# Patient Record
Sex: Female | Born: 2000 | Hispanic: Yes | Marital: Married | Smoking: Never smoker
Health system: Southern US, Community
[De-identification: ages and names within clinical notes are randomized; demographics above are authoritative.]

## PROBLEM LIST (undated history)

## (undated) DIAGNOSIS — I471 Supraventricular tachycardia: Secondary | ICD-10-CM

## (undated) DIAGNOSIS — D649 Anemia, unspecified: Secondary | ICD-10-CM

## (undated) DIAGNOSIS — R519 Headache, unspecified: Secondary | ICD-10-CM

## (undated) HISTORY — PX: TONSILLECTOMY: SUR1361

---
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
## 2020-01-14 HISTORY — PX: TONSILLECTOMY: SUR1361

## 2020-02-22 ENCOUNTER — Encounter (HOSPITAL_COMMUNITY): Payer: Self-pay | Admitting: Emergency Medicine

## 2020-02-22 ENCOUNTER — Emergency Department (HOSPITAL_COMMUNITY): Payer: PRIVATE HEALTH INSURANCE

## 2020-02-22 ENCOUNTER — Other Ambulatory Visit: Payer: Self-pay

## 2020-02-22 ENCOUNTER — Emergency Department (HOSPITAL_COMMUNITY)
Admission: EM | Admit: 2020-02-22 | Discharge: 2020-02-22 | Disposition: A | Discharge status: Home / Self Care | Payer: PRIVATE HEALTH INSURANCE | Attending: Emergency Medicine | Admitting: Emergency Medicine

## 2020-02-22 DIAGNOSIS — R0602 Shortness of breath: Secondary | ICD-10-CM | POA: Diagnosis not present

## 2020-02-22 DIAGNOSIS — R1310 Dysphagia, unspecified: Secondary | ICD-10-CM | POA: Diagnosis present

## 2020-02-22 LAB — POC URINE PREG, ED: Preg Test, Ur: NEGATIVE

## 2020-02-22 MED ORDER — DEXAMETHASONE SODIUM PHOSPHATE 10 MG/ML IJ SOLN
10.0000 mg | Freq: Once | INTRAMUSCULAR | Status: AC
  Administered 2020-02-22: 10 mg via INTRAVENOUS
  Filled 2020-02-22: qty 1

## 2020-02-22 MED ORDER — MORPHINE SULFATE (PF) 2 MG/ML IV SOLN
4.0000 mg | Freq: Once | INTRAVENOUS | Status: AC
  Administered 2020-02-22: 4 mg via INTRAVENOUS
  Filled 2020-02-22: qty 2

## 2020-02-22 MED ORDER — ONDANSETRON HCL 4 MG/2ML IJ SOLN
4.0000 mg | Freq: Once | INTRAMUSCULAR | Status: AC
  Administered 2020-02-22: 4 mg via INTRAVENOUS
  Filled 2020-02-22: qty 2

## 2020-02-22 MED ORDER — SODIUM CHLORIDE 0.9 % IV BOLUS
1000.0000 mL | Freq: Once | INTRAVENOUS | Status: AC
  Administered 2020-02-22: 1000 mL via INTRAVENOUS

## 2020-02-22 NOTE — ED Notes (Signed)
Pt requested ice pack for her neck, Ice given

## 2020-02-22 NOTE — Discharge Instructions (Addendum)
-  You were given a steroid today that should help with pain and inflammation over the next several days.  On your exam there were no obvious signs of postsurgical infection.  -Patient currently stable hydrated try to drink fluids as much as possible.  Advance your diet as tolerated.  -Follow-up with your surgeon for symptom recheck.  -Return to emergency room if any new or worsening symptoms.

## 2020-02-22 NOTE — ED Provider Notes (Signed)
[facility] EMERGENCY DEPARTMENT Provider Note   CSN: [id number] Arrival date & time: 02/22/20  1510     History Chief Complaint  Patient presents with  . Dysphagia    Cassandra Shaffer is a 20 y.o. female with no known past medical history.  HPI Patient presents to emergency room today with chief complaint of dysphagia and shortness of breath x1 day.  Patient had a tonsillectomy x 2 days ago at outside hospital.  States when she woke up this morning she felt like her throat was tight and she was having difficulty breathing. She called her surgeon who recommended she be seen in the emergency department. Patient reports she has had decreased PO intake secondary to pain. She has been unable to take pain medicine because of dysphagia. She has tried applying ice to her neck without symptom improvement. She rates pain 7/10 in severity. She denies fevers or chills at home. She was not prescribed any post op antibiotics. Also denies chest pain or hemoptysis.    History reviewed. No pertinent past medical history.  There are no problems to display for this patient.   History reviewed. No pertinent surgical history.   OB History   No obstetric history on file.     No family history on file.     Home Medications Prior to Admission medications   Not on File    Allergies    Patient has no allergy information on record.  Review of Systems   Review of Systems All other systems are reviewed and are negative for acute change except as noted in the HPI.  Physical Exam Updated Vital Signs BP 119/77   Pulse 93   Temp 98.7 F (37.1 C) (Oral)   Resp 14   SpO2 100%   Physical Exam Vitals and nursing note reviewed.  Constitutional:      Appearance: She is well-developed. She is not ill-appearing or toxic-appearing.  HENT:     Head: Normocephalic and atraumatic.     Nose: Nose normal.     Mouth/Throat:     Mouth: No angioedema.     Pharynx: Uvula midline.  No uvula swelling.     Comments: Posterior pharynx with white patches consistent with post op tonsillectomy without surrounding erythema.  No purulent drainage. No gross abscess of infection Eyes:     General: No scleral icterus.       Right eye: No discharge.        Left eye: No discharge.     Conjunctiva/sclera: Conjunctivae normal.  Neck:     Vascular: No JVD.  Cardiovascular:     Rate and Rhythm: Normal rate and regular rhythm.     Pulses: Normal pulses.     Heart sounds: Normal heart sounds.  Pulmonary:     Effort: Pulmonary effort is normal.     Breath sounds: Normal breath sounds.     Comments: Oxygen saturation is 100% during exam Abdominal:     General: There is no distension.  Musculoskeletal:        General: Normal range of motion.     Cervical back: Normal range of motion.  Skin:    General: Skin is warm and dry.  Neurological:     Mental Status: She is oriented to person, place, and time.     GCS: GCS eye subscore is 4. GCS verbal subscore is 5. GCS motor subscore is 6.     Comments: Fluent speech, no facial droop.  Psychiatric:  Behavior: Behavior normal.     ED Results / Procedures / Treatments   Labs (all labs ordered are listed, but only abnormal results are displayed) Labs Reviewed  POC URINE PREG, ED    EKG None  Radiology DG Chest 2 View  Result Date: 02/22/2020 CLINICAL DATA:  Shortness of breath EXAM: CHEST - 2 VIEW COMPARISON:  None. FINDINGS: The heart size and mediastinal contours are within normal limits. Both lungs are clear. The visualized skeletal structures are unremarkable. IMPRESSION: No active cardiopulmonary disease. Electronically Signed   By: Alcide Clever M.D.   On: 02/22/2020 16:13    Procedures Procedures   Medications Ordered in ED Medications  sodium chloride 0.9 % bolus 1,000 mL (0 mLs Intravenous Stopped 02/22/20 2108)  morphine 2 MG/ML injection 4 mg (4 mg Intravenous Given 02/22/20 2012)  ondansetron (ZOFRAN)  injection 4 mg (4 mg Intravenous Given 02/22/20 2011)  dexamethasone (DECADRON) injection 10 mg (10 mg Intravenous Given 02/22/20 2012)    ED Course  I have reviewed the triage vital signs and the nursing notes.  Pertinent labs & imaging results that were available during my care of the patient were reviewed by me and considered in my medical decision making (see chart for details).    MDM Rules/Calculators/A&P                          History provided by patient with additional history obtained from chart review.    Patient presenting with dysphagia after recent tonsillectomy.  She is afebrile, well-appearing and in no acute distress.  On exam she has what you would expect after tonsillectomy with white patches on the back of her throat.  No erythema or signs of abscess or infection.  Lungs are clear to auscultation all fields and she has normal work of breathing.  No edema appreciated. Patient given IV fluids, pain medicine and Decadron.  On reassessment pain has significantly improved.  She is tolerating p.o. intake without difficulty.  Suspect dysphagia caused to recent intubation for her procedure.  Will discharge home with symptomatic care and discussed importance of hydration.  Recommend she follow-up with surgeon for symptom recheck.  Strict return precautions discussed.  Patient discharged home in stable condition    Portions of this note were generated with Scientist, clinical (histocompatibility and immunogenetics). Dictation errors may occur despite best attempts at proofreading. y\  Final Clinical Impression(s) / ED Diagnoses Final diagnoses:  Dysphagia, unspecified type    Rx / DC Orders ED Discharge Orders    None       Kandice Hams 02/22/20 2240    Milagros Loll, MD 02/24/20 [phone]

## 2020-02-22 NOTE — ED Triage Notes (Signed)
Patient here for complaint of dysphagia and shortness of breath that started this morning when she woke up. Tonsillectomy this passed Monday. Patient alert and oriented, SpO2 100% on room air.

## 2020-02-28 ENCOUNTER — Ambulatory Visit (HOSPITAL_COMMUNITY)
Admission: EM | Admit: 2020-02-28 | Discharge: 2020-02-28 | Disposition: A | Discharge status: Home / Self Care | Payer: PRIVATE HEALTH INSURANCE | Attending: Urgent Care | Admitting: Urgent Care

## 2020-02-28 ENCOUNTER — Other Ambulatory Visit: Payer: Self-pay

## 2020-02-28 ENCOUNTER — Encounter (HOSPITAL_COMMUNITY): Payer: Self-pay | Admitting: Emergency Medicine

## 2020-02-28 DIAGNOSIS — J029 Acute pharyngitis, unspecified: Secondary | ICD-10-CM | POA: Diagnosis not present

## 2020-02-28 DIAGNOSIS — Z9089 Acquired absence of other organs: Secondary | ICD-10-CM

## 2020-02-28 DIAGNOSIS — R000 Tachycardia, unspecified: Secondary | ICD-10-CM

## 2020-02-28 DIAGNOSIS — R070 Pain in throat: Secondary | ICD-10-CM

## 2020-02-28 DIAGNOSIS — R1310 Dysphagia, unspecified: Secondary | ICD-10-CM

## 2020-02-28 MED ORDER — CLINDAMYCIN HCL 300 MG PO CAPS: 300.0000 mg | ORAL_CAPSULE | Freq: Three times a day (TID) | ORAL | 0 refills | Status: DC

## 2020-02-28 MED ORDER — NAPROXEN 500 MG PO TABS: 500.0000 mg | ORAL_TABLET | Freq: Two times a day (BID) | ORAL | 0 refills | Status: DC

## 2020-02-28 NOTE — ED Provider Notes (Addendum)
[facility]   MRN: [id number] DOB: May 14, 2000  Subjective:   Cassandra Shaffer is a 20 y.o. female presenting for checkup ongoing moderate to severe throat pain, painful swallowing, decreased appetite.  Patient had a tonsillectomy on 02/20/2020.  Has been in contact with her surgeons practice in [place] but they have not seen her back.  They do not plan on it until March.  They have directed her to go to the emergency room which the patient did.  There she was given a steroid and morphine injection without any change in her symptoms.  She has been using Tylenol and ibuprofen without relief as well.  She has not used a opioid pain medication prescribed by her surgeon.  She continued to have pain and therefore her surgeon still did not see her and redirected her to our clinic.  No current facility-administered medications for this encounter. No current outpatient medications on file.   Allergies  Allergen Reactions  . Amoxicillin   . Augmentin [Amoxicillin-Pot Clavulanate]   . Doxycycline     History reviewed. No pertinent past medical history.   Past Surgical History:  Procedure Laterality Date  . TONSILLECTOMY      Family History  Problem Relation Age of Onset  . Diabetes Mother     Social History   Tobacco Use  . Smoking status: Never Smoker  Substance Use Topics  . Alcohol use: Yes    Comment: rare  . Drug use: Never    ROS   Objective:   Vitals: BP 123/78 (BP Location: Right Arm)   Pulse (!) 109   Temp 99 F (37.2 C) (Oral)   Resp 18   LMP 01/27/2020   SpO2 99%   Physical Exam Constitutional:      General: She is not in acute distress.    Appearance: Normal appearance. She is well-developed. She is not ill-appearing, toxic-appearing or diaphoretic.  HENT:     Head: Normocephalic and atraumatic.     Nose: Nose normal.     Mouth/Throat:     Mouth: Mucous membranes are moist.     Pharynx: Oropharynx is clear.     Comments: Tonsils  absent bilaterally but has overlying erythema and exudate. Eyes:     General: No scleral icterus.       Right eye: No discharge.        Left eye: No discharge.     Extraocular Movements: Extraocular movements intact.     Conjunctiva/sclera: Conjunctivae normal.     Pupils: Pupils are equal, round, and reactive to light.  Cardiovascular:     Rate and Rhythm: Normal rate.  Pulmonary:     Effort: Pulmonary effort is normal.  Skin:    General: Skin is warm and dry.  Neurological:     General: No focal deficit present.     Mental Status: She is alert and oriented to person, place, and time.  Psychiatric:        Mood and Affect: Mood normal.        Behavior: Behavior normal.        Thought Content: Thought content normal.        Judgment: Judgment normal.      Assessment and Plan :   PDMP not reviewed this encounter.  1. Throat infection   2. Status post tonsillectomy   3. Throat pain   4. Painful swallowing     Emphasized need for follow-up with her oral surgeon as she continues  to have symptoms.  In the meantime advised that she start using the narcotic pain medicine they prescribed.  Schedule Tylenol and naproxen as well.  Will use clindamycin to address infectious process status post surgery.  Suspect tachycardia related to infectious process versus dehydration versus pain or all of the above.  Counseled patient on potential for adverse effects with medications prescribed/recommended today, ER and return-to-clinic precautions discussed, patient verbalized understanding.   Wallis Bamberg, [place] 02/28/20 1245

## 2020-02-28 NOTE — ED Triage Notes (Signed)
Patient had tonsils removed last week on 02/20/2020.  Patient says she cannot swallow, it is very painful. Surgeon is in [place].  She spoke to him and showed him a picture. He has given the impression there may be infection and suggested she be seen.   Patient was seen in ED on 02/22/2020

## 2020-05-09 ENCOUNTER — Ambulatory Visit (HOSPITAL_COMMUNITY)
Admission: EM | Admit: 2020-05-09 | Discharge: 2020-05-09 | Disposition: A | Discharge status: Home / Self Care | Payer: 59 | Attending: Family Medicine | Admitting: Family Medicine

## 2020-05-09 ENCOUNTER — Encounter (HOSPITAL_COMMUNITY): Payer: Self-pay | Admitting: Emergency Medicine

## 2020-05-09 ENCOUNTER — Other Ambulatory Visit: Payer: Self-pay

## 2020-05-09 ENCOUNTER — Ambulatory Visit (INDEPENDENT_AMBULATORY_CARE_PROVIDER_SITE_OTHER): Payer: 59

## 2020-05-09 DIAGNOSIS — M25571 Pain in right ankle and joints of right foot: Secondary | ICD-10-CM

## 2020-05-09 DIAGNOSIS — W228XXA Striking against or struck by other objects, initial encounter: Secondary | ICD-10-CM | POA: Diagnosis not present

## 2020-05-09 DIAGNOSIS — M79671 Pain in right foot: Secondary | ICD-10-CM | POA: Diagnosis not present

## 2020-05-09 MED ORDER — DICLOFENAC SODIUM 75 MG PO TBEC: 75.0000 mg | DELAYED_RELEASE_TABLET | Freq: Two times a day (BID) | ORAL | 0 refills | Status: AC

## 2020-05-09 NOTE — ED Provider Notes (Signed)
[facility] CARE [facility]ENTER   [id number] 05/09/20 Arrival Time: 1137  ASSESSMENT & PLAN:  1. Right foot pain     I have personally viewed the imaging studies ordered this visit. No fracture appreciated.  Begin: Meds ordered this encounter  Medications  . diclofenac (VOLTAREN) 75 MG EC tablet    Sig: Take 1 tablet (75 mg total) by mouth 2 (two) times daily.    Dispense:  14 tablet    Refill:  0   WBAT. Orders Placed This Encounter  Procedures  . DG Foot Complete Right  . Apply CAM boot    Recommend:  Follow-up Information    [facility].   Specialty: Urgent Care Why: As needed. Contact information: [address] [place] [address] [phone]               Reviewed expectations re: course of current medical issues. Questions answered. Outlined signs and symptoms indicating need for more acute intervention. Patient verbalized understanding. After Visit Summary given.  SUBJECTIVE: History from: patient. Cassandra Shaffer is a 20 y.o. female who reports persistent moderate pain of her right lateral foot; described as aching; without radiation. Onset: abrupt. First noted: today. Injury/trama: dropped heavy candle onto foot; immediate pain; able to bear weight with discomfort. Symptoms have progressed to a point and plateaued since beginning. Aggravating factors: certain movements and weight bearing. Alleviating factors: have not been identified. Associated symptoms: none reported. Extremity sensation changes or weakness: none. Self treatment: has not tried OTC therapies.  History of similar: no.  Past Surgical History:  Procedure Laterality Date  . TONSILLECTOMY        OBJECTIVE:  Vitals:   05/09/20 1330  BP: 103/71  Pulse: 98  Resp: 18  Temp: 98.1 F (36.7 C)  TempSrc: Oral  SpO2: 100%    General appearance: alert; no distress HEENT: [place]; AT Neck: supple with FROM Resp: unlabored  respirations Extremities: . RLE: warm with well perfused appearance; fairly well localized moderate tenderness over right lateral midfoot; without gross deformities; swelling: minimal; bruising: minimal; ankle ROM: normal CV: brisk extremity capillary refill of RLE; 2+ DP pulse of RLE. Skin: warm and dry; no visible rashes Neurologic: normal sensation and strength of RLE Psychological: alert and cooperative; normal mood and affect  Imaging: DG Foot Complete Right  Result Date: 05/09/2020 CLINICAL DATA:  Pain after dropping heavy object on foot EXAM: RIGHT FOOT COMPLETE - 3+ VIEW COMPARISON:  None. FINDINGS: Frontal, oblique, and lateral views were obtained. There is no fracture or dislocation. Joint spaces appear normal. No erosive change. IMPRESSION: No fracture or dislocation.  No evident arthropathy. Electronically Signed   By: Bretta Bang III M.D.   On: 05/09/2020 14:03      Allergies  Allergen Reactions  . Amoxicillin   . Augmentin [Amoxicillin-Pot Clavulanate]   . Doxycycline     History reviewed. No pertinent past medical history. Social History   Socioeconomic History  . Marital status: Single    Spouse name: Not on file  . Number of children: Not on file  . Years of education: Not on file  . Highest education level: Not on file  Occupational History  . Not on file  Tobacco Use  . Smoking status: Never Smoker  . Smokeless tobacco: Never Used  Vaping Use  . Vaping Use: Never used  Substance and Sexual Activity  . Alcohol use: Yes    Comment: rare  . Drug use: Never  .  Sexual activity: Not on file  Other Topics Concern  . Not on file  Social History Narrative  . Not on file   Social Determinants of Health   Financial Resource Strain: Not on file  Food Insecurity: Not on file  Transportation Needs: Not on file  Physical Activity: Not on file  Stress: Not on file  Social Connections: Not on file   Family History  Problem Relation Age of Onset  .  Diabetes Mother    Past Surgical History:  Procedure Laterality Date  . Greig Right, MD 05/09/20 1422

## 2020-05-09 NOTE — ED Triage Notes (Signed)
Dropped a heavy candle on right lateral foot this morning.  Pain and swelling to top of foot patient can move toes, but it is very painful.  Right pedal pulse 2 +

## 2020-05-09 NOTE — ED Notes (Signed)
Denies any risk of pregnancy

## 2020-12-04 LAB — HEPATITIS B SURFACE ANTIGEN W/ REFLEX TO CONFIRMATION: Hepatitis B Surface Antigen: NEGATIVE

## 2020-12-04 LAB — RUBELLA ANTIBODY, IGG: Rubella AB, IgG: IMMUNE

## 2021-01-13 NOTE — L&D Delivery Note (Signed)
Delivery Summary for [facility],  No LMP recorded. Patient is pregnant., Estimated Date of Delivery: 07/16/21    Pt admitted at: 06/28/2021  2:19 PM    Patient Active Problem List   Diagnosis    Abdominal pain affecting pregnancy    Nausea and vomiting during pregnancy    Palpitation    Decreased fetal movement    Headache    Delivery normal       Time of Delivery: 2108  07/01/2021     Attending/Supervising MD:  Jobie Quaker, MD    Delivering Provider: Jobie Quaker, MD    Procedure: Spontaneous vaginal delivery    Anesthesia: Epidural     A female  infant was delivered from LOA presentation.    Infant Wt:      7 lb 2.5 oz (3245 g)    Infant Name:  Elijah      Apgars:  7  at 32mnute and 9  at 5 minutes    ROM Time: 1030 06/28/21  Amniotic Fluid:         Placenta and Cord:          Mechanism:       Description:      Episiotomy: None     Lacerations:     Laceration Type: None    Episiotomy/Laceration Repair: None     Estimated Blood Loss:  365 cc        Specimens: None Disposition: discarded           Complications:  Maternal temperature post delivery 100.4 after placement of Cytotec, Tylenol 650 mg given.    Sponge and instrument counts correct.       SJobie Quaker MD

## 2021-04-18 LAB — SYPHILIS SCREEN IGG AND IGM: Syphilis Screen IgG and IgM: NONREACTIVE

## 2021-04-18 LAB — HIV AG/AB 4TH GENERATION: HIV Ag/Ab, 4th Generation: NONREACTIVE

## 2021-04-18 LAB — RPR: RPR: NONREACTIVE

## 2021-05-22 ENCOUNTER — Observation Stay
Admission: EM | Admit: 2021-05-22 | Discharge: 2021-05-22 | Disposition: A | Discharge status: Home / Self Care | Payer: PRIVATE HEALTH INSURANCE | Source: Ambulatory Visit | Attending: Obstetrics & Gynecology | Admitting: Obstetrics & Gynecology

## 2021-05-22 DIAGNOSIS — O26899 Other specified pregnancy related conditions, unspecified trimester: Principal | ICD-10-CM | POA: Insufficient documentation

## 2021-05-22 DIAGNOSIS — R109 Unspecified abdominal pain: Secondary | ICD-10-CM | POA: Diagnosis present

## 2021-05-22 HISTORY — DX: Headache, unspecified: R51.9

## 2021-05-22 MED ORDER — NITROFURANTOIN MONOHYD MACRO 100 MG PO CAPS
100.0000 mg | ORAL_CAPSULE | Freq: Once | ORAL | Status: AC
Start: 2021-05-22 — End: 2021-05-22
  Administered 2021-05-22: 100 mg via ORAL
  Filled 2021-05-22: qty 1

## 2021-05-22 NOTE — Progress Notes (Signed)
Given discharge instructions to pt. Pt voiced understanding. D/C to home. Patient ambulated off the unit with FOB.

## 2021-05-22 NOTE — Progress Notes (Signed)
Dr. Norberto Sorenson at bedside to assess pt. SVE done. Orders received for one dose of macrobid and discharge.

## 2021-05-22 NOTE — Progress Notes (Addendum)
Christina Reilly is a 21 y.o. female [id number]   Chief Complaint   Patient presents with    Contractions        Past Medical History:   Diagnosis Date    Headache     uses tylenol for migraines    Hx MRSA infection 2018       Pt reports feeling uterine contractions rating 5/10 in pain since this morning and does not remember what time they started.   Pt denies leakage of fluid.  Pt denies vaginal bleeding.  Pt reports Positive fetal movement.     Pt oriented to room and external monitors are explained and applied. History and physical assessment started, and plan of care discussed with pt. Pt accompanied by FOB. MD notified of pt's arrival.

## 2021-05-22 NOTE — Progress Notes (Signed)
Pt's tachycardia reviewed with Dr. Norberto Sorenson. No further orders received at this time.

## 2021-05-22 NOTE — Discharge Instr - AVS First Page (Addendum)
Home Undelivered Discharge Instructions    After Discharge Orders:          Diet:  normal diet as tolerated    Rest: normal activity as tolerated    Other instructions: Do kick counts once a day on your baby. Choose the time of day your baby is most active. Get in a comfortable lying or sitting position and time how long it takes to feel 10 kicks, twists, turns, swishes, or rolls. Call your physician or midwife if there have not been 10 kicks in 1 hours    Vitals:    05/22/21 1927   BP: 116/68   Pulse: (!) 122   Resp: 18   Temp: 98.6 F (37 C)   SpO2: 97%        Medication List      You have not been prescribed any medications.     Macrobid      Call physician or midwife, return to Labor and Delivery if:     Contractions become stronger and regular, every 5 min for one hour. (Prior to 36 weeks, call if you have more than 4 contractions in an hour).  Bag of water ruptures (sudden gush or continuous leak) Even if no contractions.  Vaginal bleeding occurs (like a menstrual period, running down legs or passing clots) Even if no contractions.  Spotting after a vaginal exam is normal.  You experience any unusually sharp abdominal pain  You notice a decrease in fetal movement.  You have persistent severe headaches.  You have blurring of vision or spots before the eyes.  You have nausea and vomiting.  You have sudden increase in swelling of hands, feet, face, and ankles.  You have chills and fever over 100.4  You have very frequent urination or burning with urination.  You have further concerns or questions.

## 2021-05-28 IMAGING — CR DG CHEST 2V
2 series · 2 of 2 positions shown · non-contrast
Comparison: None.

CLINICAL DATA: Shortness of breath

EXAM:
CHEST - 2 VIEW

[chest pa]
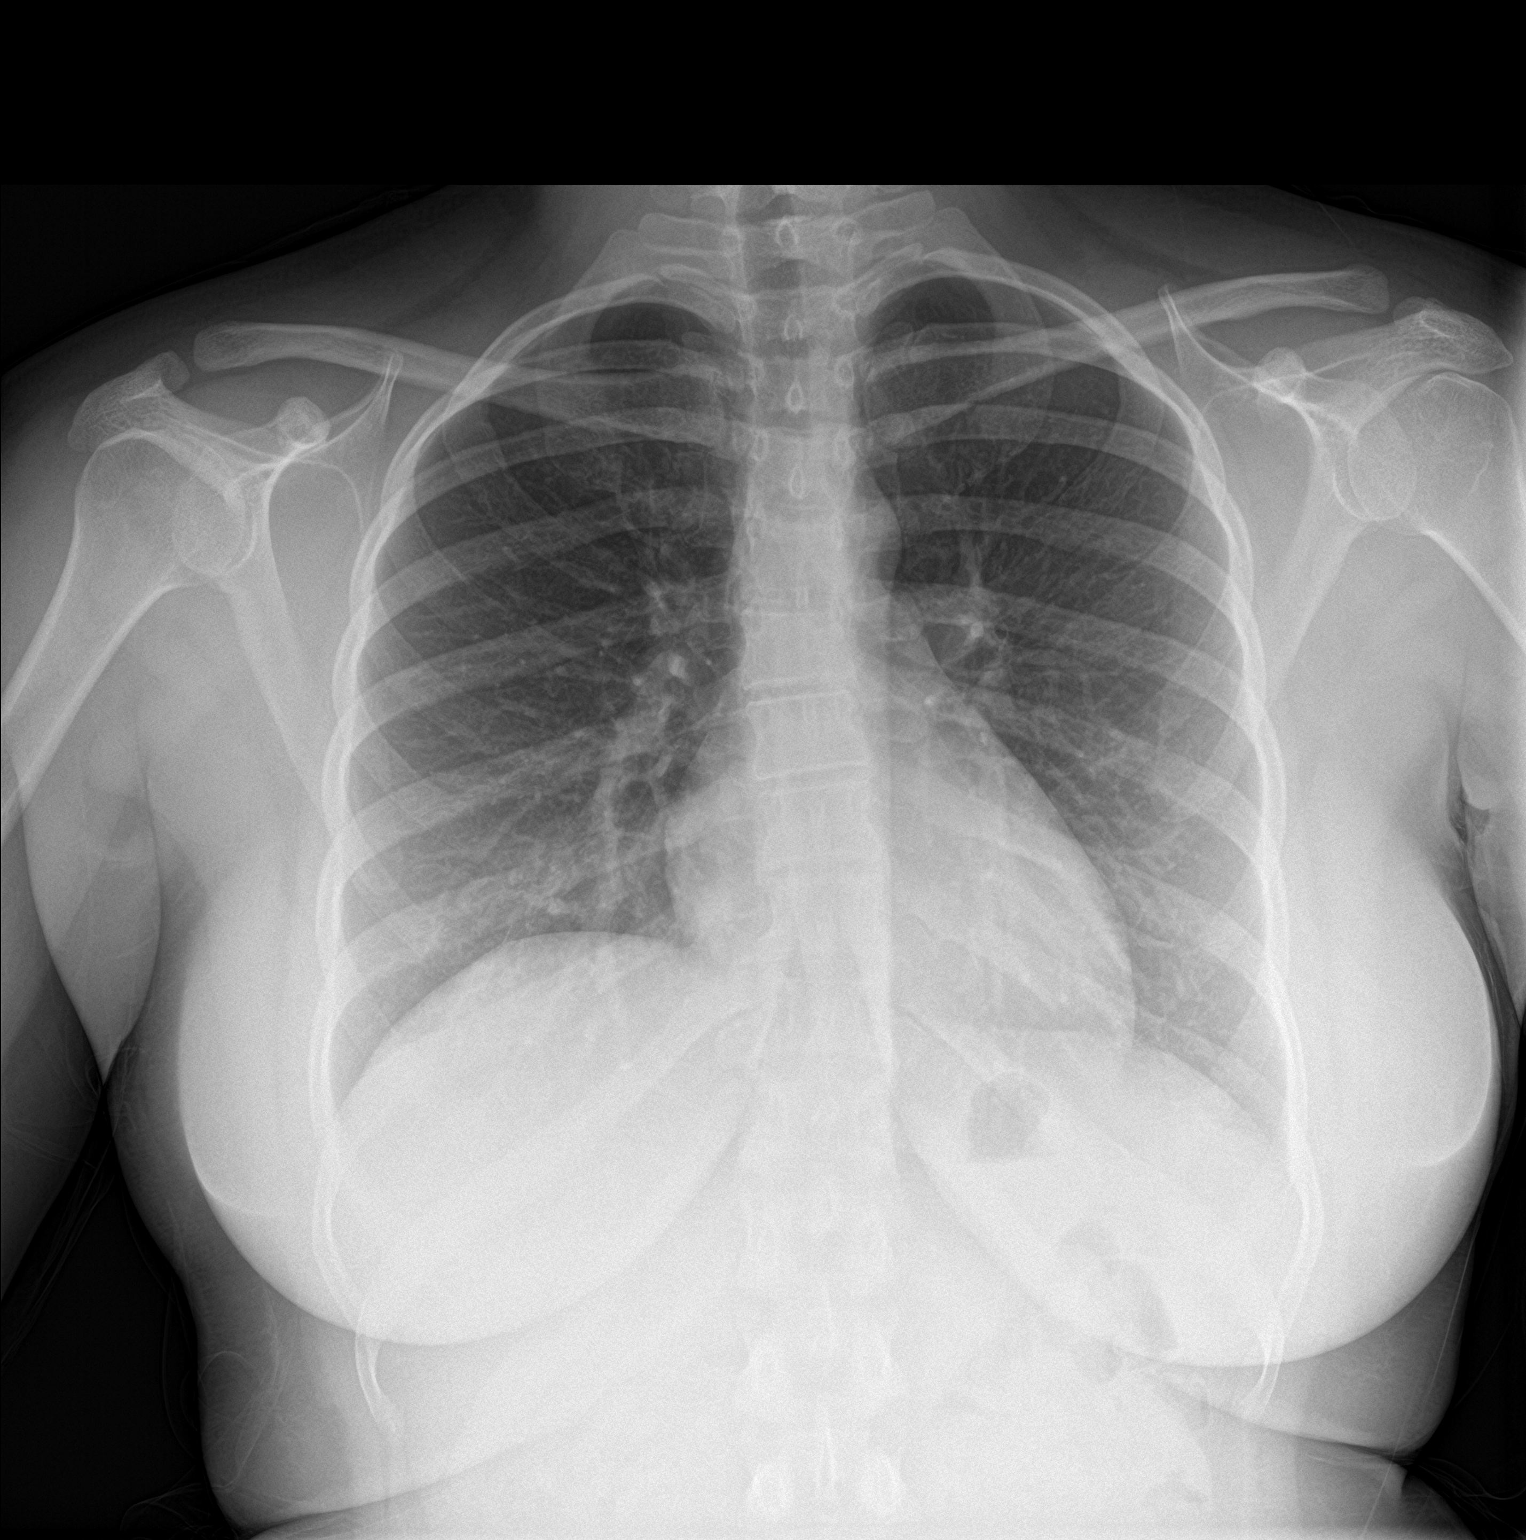

[chest lat]
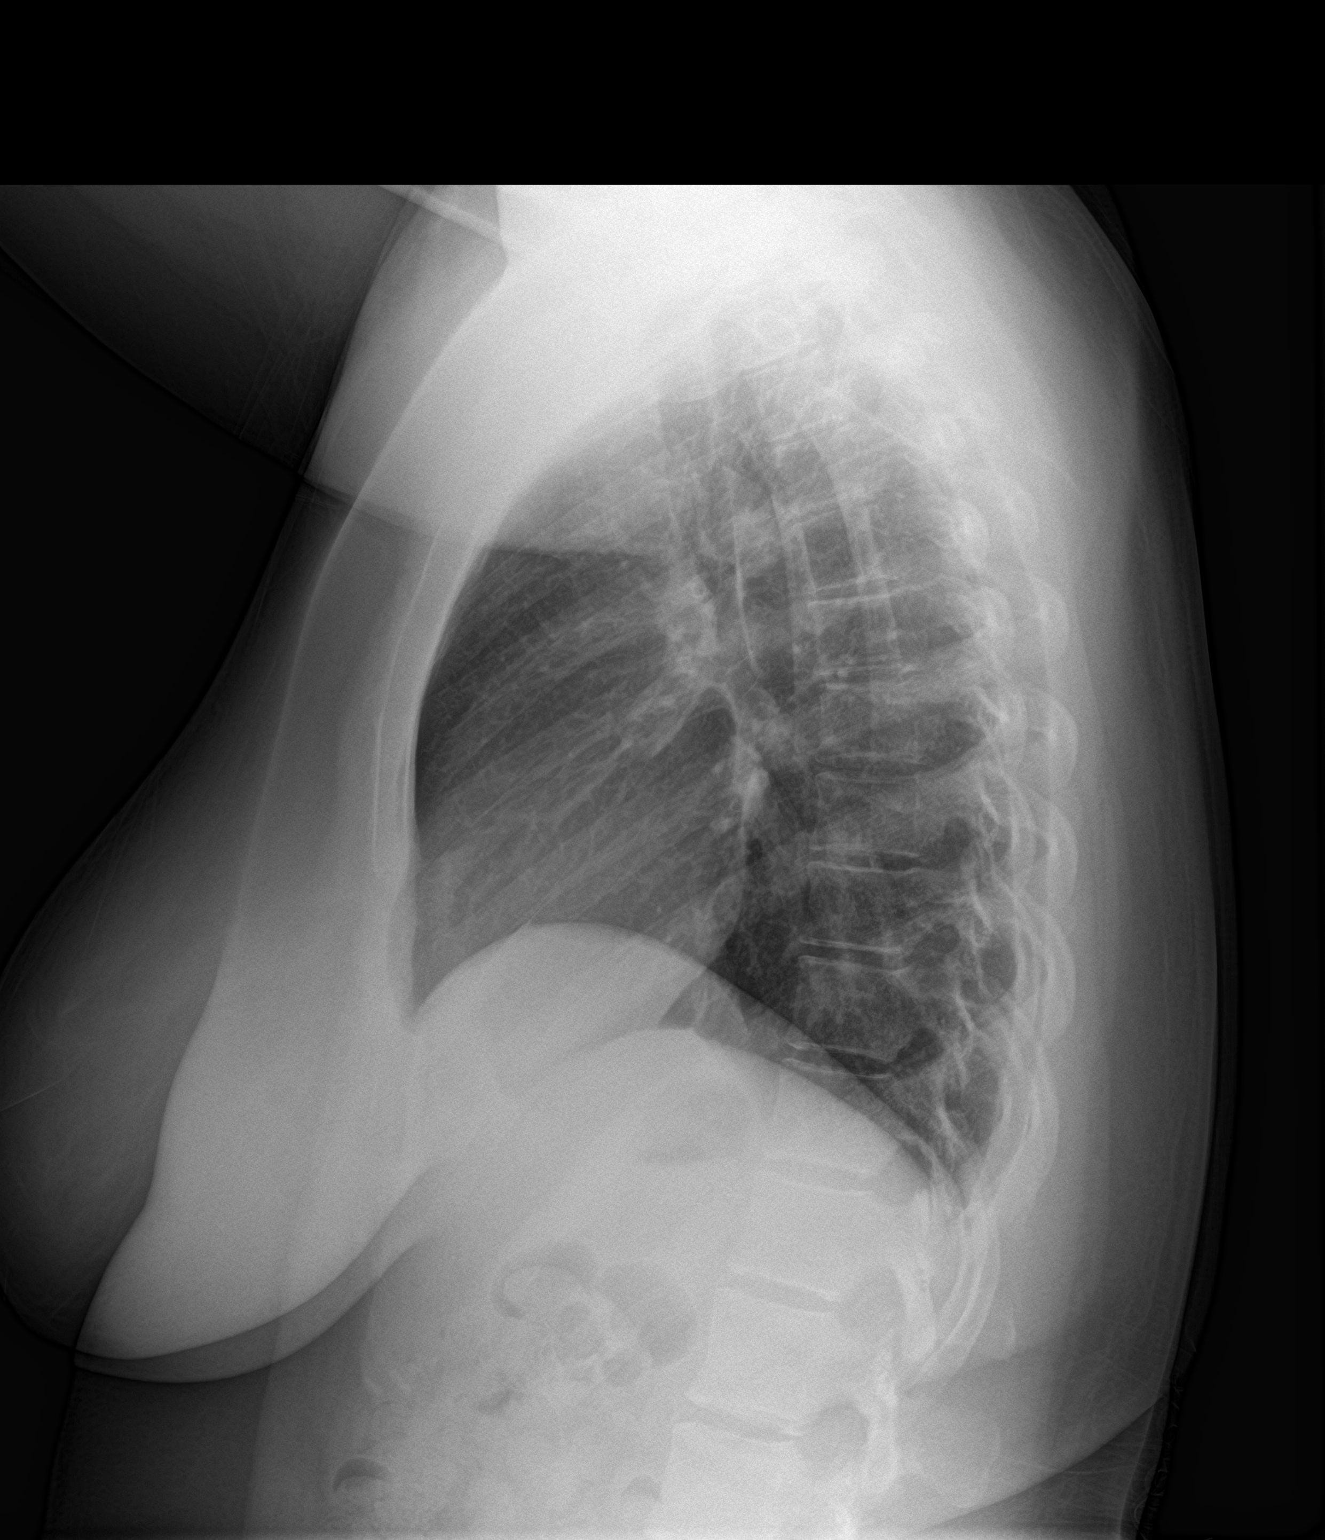

[2 of 2 positions shown; findings below may reference images not displayed]

FINDINGS: The heart size and mediastinal contours are within normal limits.
Both lungs are clear. The visualized skeletal structures are
unremarkable.
IMPRESSION: No active cardiopulmonary disease.

## 2021-05-29 LAB — GONOCOCCUS CULTURE
Chlamydia trachomatis Culture: NEGATIVE
Culture Gonorrhoeae: NEGATIVE

## 2021-06-13 ENCOUNTER — Encounter: Payer: Self-pay | Admitting: Obstetrics & Gynecology

## 2021-06-13 ENCOUNTER — Observation Stay
Admission: RE | Admit: 2021-06-13 | Discharge: 2021-06-13 | Disposition: A | Discharge status: Home / Self Care | Payer: PRIVATE HEALTH INSURANCE | Source: Ambulatory Visit | Attending: Obstetrics & Gynecology | Admitting: Obstetrics & Gynecology

## 2021-06-13 DIAGNOSIS — O368130 Decreased fetal movements, third trimester, not applicable or unspecified: Principal | ICD-10-CM | POA: Insufficient documentation

## 2021-06-13 DIAGNOSIS — R55 Syncope and collapse: Secondary | ICD-10-CM | POA: Insufficient documentation

## 2021-06-13 DIAGNOSIS — O99283 Endocrine, nutritional and metabolic diseases complicating pregnancy, third trimester: Secondary | ICD-10-CM | POA: Insufficient documentation

## 2021-06-13 DIAGNOSIS — O368190 Decreased fetal movements, unspecified trimester, not applicable or unspecified: Secondary | ICD-10-CM

## 2021-06-13 DIAGNOSIS — O212 Late vomiting of pregnancy: Secondary | ICD-10-CM | POA: Insufficient documentation

## 2021-06-13 DIAGNOSIS — Z3A35 35 weeks gestation of pregnancy: Secondary | ICD-10-CM | POA: Insufficient documentation

## 2021-06-13 DIAGNOSIS — O219 Vomiting of pregnancy, unspecified: Secondary | ICD-10-CM | POA: Diagnosis present

## 2021-06-13 DIAGNOSIS — R002 Palpitations: Secondary | ICD-10-CM

## 2021-06-13 DIAGNOSIS — O99013 Anemia complicating pregnancy, third trimester: Secondary | ICD-10-CM | POA: Insufficient documentation

## 2021-06-13 DIAGNOSIS — E860 Dehydration: Secondary | ICD-10-CM | POA: Insufficient documentation

## 2021-06-13 HISTORY — DX: Anemia, unspecified: D64.9

## 2021-06-13 HISTORY — DX: Supraventricular tachycardia: I47.1

## 2021-06-13 LAB — CBC AND DIFFERENTIAL
Absolute NRBC: 0 10*3/uL (ref 0.00–0.00)
Basophils Absolute Automated: 0.01 10*3/uL (ref 0.00–0.08)
Basophils Automated: 0.1 %
Eosinophils Absolute Automated: 0.04 10*3/uL (ref 0.00–0.44)
Eosinophils Automated: 0.5 %
Hematocrit: 30.5 % — ABNORMAL LOW (ref 34.7–43.7)
Hgb: 9.6 g/dL — ABNORMAL LOW (ref 11.4–14.8)
Immature Granulocytes Absolute: 0.08 10*3/uL — ABNORMAL HIGH (ref 0.00–0.07)
Immature Granulocytes: 1 %
Instrument Absolute Neutrophil Count: 5.84 10*3/uL (ref 1.10–6.33)
Lymphocytes Absolute Automated: 1.37 10*3/uL (ref 0.42–3.22)
Lymphocytes Automated: 17 %
MCH: 25.7 pg (ref 25.1–33.5)
MCHC: 31.5 g/dL (ref 31.5–35.8)
MCV: 81.8 fL (ref 78.0–96.0)
MPV: 11.3 fL (ref 8.9–12.5)
Monocytes Absolute Automated: 0.73 10*3/uL (ref 0.21–0.85)
Monocytes: 9 %
Neutrophils Absolute: 5.84 10*3/uL (ref 1.10–6.33)
Neutrophils: 72.4 %
Nucleated RBC: 0 /100 WBC (ref 0.0–0.0)
Platelets: 174 10*3/uL (ref 142–346)
RBC: 3.73 10*6/uL — ABNORMAL LOW (ref 3.90–5.10)
RDW: 17 % — ABNORMAL HIGH (ref 11–15)
WBC: 8.07 10*3/uL (ref 3.10–9.50)

## 2021-06-13 LAB — ECG 12-LEAD
Atrial Rate: 95 {beats}/min
IHS MUSE NARRATIVE AND IMPRESSION: NORMAL
P Axis: 31 degrees
P-R Interval: 152 ms
Q-T Interval: 346 ms
QRS Duration: 76 ms
QTC Calculation (Bezet): 434 ms
R Axis: 61 degrees
T Axis: 20 degrees
Ventricular Rate: 95 {beats}/min

## 2021-06-13 LAB — COMPREHENSIVE METABOLIC PANEL
ALT: 6 U/L (ref 0–55)
AST (SGOT): 13 U/L (ref 5–41)
Albumin/Globulin Ratio: 1 (ref 0.9–2.2)
Albumin: 2.6 g/dL — ABNORMAL LOW (ref 3.5–5.0)
Alkaline Phosphatase: 163 U/L — ABNORMAL HIGH (ref 37–117)
Anion Gap: 8 (ref 5.0–15.0)
BUN: <3 mg/dL — ABNORMAL LOW (ref 7.0–21.0)
Bilirubin, Total: 0.3 mg/dL (ref 0.2–1.2)
CO2: 20 mEq/L (ref 17–29)
Calcium: 8.4 mg/dL — ABNORMAL LOW (ref 8.5–10.5)
Chloride: 107 mEq/L (ref 99–111)
Creatinine: 0.5 mg/dL (ref 0.4–1.0)
Globulin: 2.5 g/dL (ref 2.0–3.6)
Glucose: 95 mg/dL (ref 70–100)
Potassium: 3.6 mEq/L (ref 3.5–5.3)
Protein, Total: 5.1 g/dL — ABNORMAL LOW (ref 6.0–8.3)
Sodium: 135 mEq/L (ref 135–145)
eGFR: >60 mL/min/{1.73_m2} (ref >=60)

## 2021-06-13 LAB — URINALYSIS, REFLEX TO MICROSCOPIC EXAM IF INDICATED
Bilirubin, UA: NEGATIVE
Blood, UA: NEGATIVE
Glucose, UA: NEGATIVE
Ketones UA: 20 — AB
Leukocyte Esterase, UA: NEGATIVE
Nitrite, UA: NEGATIVE
Protein, UR: NEGATIVE
Specific Gravity UA: 1.011 (ref 1.001–1.035)
Urine pH: 6 (ref 5.0–8.0)
Urobilinogen, UA: NORMAL mg/dL (ref 0.2–2.0)

## 2021-06-13 LAB — TYPE AND SCREEN
AB Screen Gel: POSITIVE
ABO Rh: AB NEG

## 2021-06-13 LAB — ANTIBODY IDENTIFICATION - REFLEX: Antibody ID: POSITIVE

## 2021-06-13 LAB — RUPTURE OF MEMBRANE AMNISURE: Rupture of Membrane AmniSure: NEGATIVE

## 2021-06-13 MED ORDER — LACTATED RINGERS IV BOLUS
1000.0000 mL | Freq: Once | INTRAVENOUS | Status: AC
Start: 2021-06-13 — End: 2021-06-13
  Administered 2021-06-13: 1000 mL via INTRAVENOUS

## 2021-06-13 MED ORDER — PROMETHAZINE HCL 25 MG PO TABS
12.5000 mg | ORAL_TABLET | Freq: Four times a day (QID) | ORAL | Status: DC | PRN
Start: 2021-06-13 — End: 2021-06-13

## 2021-06-13 MED ORDER — SODIUM CHLORIDE 0.9 % IV SOLN
6.2500 mg | Freq: Four times a day (QID) | INTRAVENOUS | Status: DC | PRN
Start: 2021-06-13 — End: 2021-06-13

## 2021-06-13 MED ORDER — ACETAMINOPHEN 325 MG PO TABS
650.0000 mg | ORAL_TABLET | ORAL | Status: DC | PRN
Start: 2021-06-13 — End: 2021-06-13
  Administered 2021-06-13: 650 mg via ORAL
  Filled 2021-06-13: qty 2

## 2021-06-13 MED ORDER — ACETAMINOPHEN 650 MG RE SUPP
650.0000 mg | RECTAL | Status: DC | PRN
Start: 2021-06-13 — End: 2021-06-13

## 2021-06-13 MED ORDER — LACTATED RINGERS IV SOLN
INTRAVENOUS | Status: DC
Start: 2021-06-13 — End: 2021-06-13

## 2021-06-13 MED ORDER — ONDANSETRON HCL 4 MG/2ML IJ SOLN
4.0000 mg | Freq: Three times a day (TID) | INTRAMUSCULAR | Status: DC | PRN
Start: 2021-06-13 — End: 2021-06-13
  Administered 2021-06-13: 4 mg via INTRAVENOUS
  Filled 2021-06-13: qty 2

## 2021-06-13 MED ORDER — CALCIUM CARBONATE ANTACID 500 MG PO CHEW
1000.0000 mg | CHEWABLE_TABLET | Freq: Three times a day (TID) | ORAL | Status: DC | PRN
Start: 2021-06-13 — End: 2021-06-13

## 2021-06-13 MED ORDER — ALUM & MAG HYDROXIDE-SIMETH 200-200-20 MG/5ML PO SUSP
30.0000 mL | Freq: Four times a day (QID) | ORAL | Status: DC | PRN
Start: 2021-06-13 — End: 2021-06-13

## 2021-06-13 MED ORDER — PROMETHAZINE HCL 25 MG RE SUPP
12.5000 mg | Freq: Four times a day (QID) | RECTAL | Status: DC | PRN
Start: 2021-06-13 — End: 2021-06-13

## 2021-06-13 MED ORDER — ONDANSETRON 4 MG PO TBDP
4.0000 mg | ORAL_TABLET | Freq: Three times a day (TID) | ORAL | Status: DC | PRN
Start: 2021-06-13 — End: 2021-06-13

## 2021-06-13 MED ORDER — SOD CITRATE-CITRIC ACID 500-334 MG/5ML PO SOLN
30.0000 mL | Freq: Once | ORAL | Status: DC | PRN
Start: 2021-06-13 — End: 2021-06-13

## 2021-06-13 NOTE — Discharge Instr - AVS First Page (Addendum)
Reason for your Hospital Admission:  Nausea, vomiting and dehydration      Instructions for after your discharge:  Preterm labor precautions and fetal kick counts    Po hydrate and take antiemetics as directed         Home Undelivered Discharge Instructions    After Discharge Orders:    Keep previously scheduled appointment with MD       Medication List        CONTINUE taking these medications      PRENATAL 1 PO                      Diet:  normal diet as tolerated    Rest: normal activity as tolerated    Other instructions: Do kick counts once a day on your baby. Choose the time of day your baby is most active. Get in a comfortable lying or sitting position and time how long it takes to feel 10 kicks, twists, turns, swishes, or rolls. Call your physician or midwife if there have not been 10 kicks in 1 hours    Call physician or midwife, return to Labor and Delivery, call 911, or go to the nearest Emergency Room if:   Contractions become stronger and regular, every 5 min for one hour. (Prior to 36 weeks, call if you have more than 4 contractions in an hour).  Bag of water ruptures (sudden gush or continuous leak) Even if no contractions.  Vaginal bleeding occurs (like a menstrual period, running down legs or passing clots) Even if no contractions.  Spotting after a vaginal exam is normal.  You experience any unusually sharp abdominal pain  You notice a decrease in fetal movement.  You have persistent severe headaches.  You have blurring of vision or spots before the eyes.  You have nausea and vomiting.  You have sudden increase in swelling of hands, feet, face, and ankles.  You have chills and fever over 100.4  You have very frequent urination or burning with urination.  You have further concerns or questions.

## 2021-06-13 NOTE — H&P (Addendum)
TRIAGE NOTE    Subjective:  Christina Reilly is a 21 yr old female   G1P0000 at [redacted]w[redacted]d presents to [facility] for complaints of nausea and vomiting x 2 days(`2-3 episodes per day) and palpitations that began today. Reports syncopal episode at home yesterday evening that was unwitnessed and does not recall any head injury. [facility] but states nothing was done and she was sent home. Also reports ?leakage of fluid ` 8pm yesterday and again at 6 am today. Denies any contractions or vaginal bleeding. Denies any recent sexual activity. No abdominal pain or fever. Had a recent negative covid test last week for fever that resolved but denies any covid symptoms. Upon arrival to L&D reported decreased fetal movement but now reports good fetal movement.     Covid vaccine status: s/p Pfizer vaccine x 2 doses     Antenatal Labs:   Blood Type: AB  negative  Rubella: Immune   Varicella: Immune   Hep B Antigen: Negative  Hep C: Negative  Hep : Negative  Syphilis Screening: Negative  HIV: Neg    Glucola: 142; normal 3 hr GTT  NIPS: normal XY  CF/SMA: negative  AFP: negative for ONTD  GBS: unknown     Tdap Vac: current   Flu Vac: current   Rhogam: 04/19/2021    PROB:  Patient Active Problem List:     MELANOCYTIC NEVUS, LEFT LEG    FHX OF SKIN CANCER    RH NEGATIVE    SUPERVISION NORMAL FIRST PREGNANCY    ANEMIA     PMH:  Past Medical History:   Diagnosis Date    MIGRAINE     UNSPECIFIED ANXIETY      PSH:  Past Surgical History:   Procedure Laterality Date    TONSILLECTOMY Bilateral 02/20/2020    Performed by SJonna Clark(M.D.), M.D. at [facility]       Medications:   PNV  IV iron infusions- last dose was 06/11/21    Allergies:  ALLERGIES   Allergen Reactions    Augmentin [Amoxicillin With Clavulanate Potassium]      vomiting    Phenergan [Promethazine Hydrochloride] Rash    Doxycycline Monohydrate Rash     Rash when on doxy; stopped, rash improved; restarted  doxy; rash recurred: 2018       OB/Gyn Hx:   G1P0000   OB History   Gravida Para Term Preterm AB Living   1 0 0 0 0 0   SAB IAB Ectopic Molar Multiple Live Births   0 0 0 0 0 0      # Outcome Date GA Lbr Len/2nd Weight Sex Delivery Anes PTL Lv   1 Current                Objective:   BP 99/66   Pulse 95   Temp 98.1 F (36.7 C) (Oral)   Ht 1.499 m ('4\' 11"'$ )   Wt 77.1 kg (170 lb)   SpO2 96%   BMI 34.34 kg/m         Estimated body mass index is 34.34 kg/m as calculated from the following:    Height as of 06/11/21: '4\' 11"'$  (1.499 m).    Weight as of 06/11/21: 170 lb (77.1 kg).  GENERAL: awake, alert, oriented x3, no acute distress   HEART: regular rate and rhythm  LUNGS: clear to auscultation bilaterally  ABDOMEN: soft, non-tender, gravid  EXTREMITIES: no  edema bilaterally   NST : 130 bpm, moderate variability, + accels, no  decels, CAT 1  TOCO : no contractions   CERVIX : 0 cm/0%/-4   No gross rupture of membranes noted on exam  EFW: 5 pounds   PELVIS: adequate for a trial of labor   Bedside Sono:  confirms vtx presentation     Lab Results   Component Value Date    URINETYPE Clean Catch 06/13/2021    COLORUA Yellow 06/13/2021    CLARITYUA Clear 06/13/2021    SPECGRAVUA 1.011 06/13/2021    URINEPH 6.0 06/13/2021    LEUA Negative 06/13/2021    NITRITEUA Negative 06/13/2021    PROTEINUR Negative 06/13/2021    BLOODUA Negative 06/13/2021    KETONESUA 20 (A) 06/13/2021    GLUCOSEUA Negative 06/13/2021    BILIUA Negative 06/13/2021    UROBILIUA Normal 06/13/2021      06/13/21  9:18 AM   Rupture of Membrane AmniSure Negative      Assessment and Plan:  SHERISE RICKERD is a 21 yr old G1P0000 with an Estimated Date of Delivery: 07/17/21 at 21w1dadmitted for nausea and vomiting- resolved with IV hydration and zofran. Patient afebrile and no evidence of infection. Patient encouraged to use antiemetic(phenergan) as needed for nausea and po hydration.    No clinical evidence of rupture of membranes. Negative  amnisure.    Dehydration- s/p IV hydration; pt encouraged to po hydrate    Palpitations due to anemia and dehydration- resolved with IV hydration. Normal EKG. Pt denies any chest pain or shortness of breath. No hx of cardiac disease.    Decreased fetal movement- Category 1 FHR tracing and patient reports increased fetal movement prior to discharge home.    PTL precautions and fetal kick counts reviewed with patient    Discharge home    F/u with primary OB provider for prenatal visit as scheduled next week    KLaurell Josephs MD

## 2021-06-13 NOTE — Progress Notes (Signed)
Discharge instructions reviewed with patient. All questions answered. IV removed, cath intact. Patient discharged home

## 2021-06-13 NOTE — Progress Notes (Signed)
Christina Reilly is a 21 y.o. female 51w2dG2P0010 here for decreased fetal movement, Nausea and vomiting.     Pt reports she last felt the baby move yesterday around 1500. Pt has only felt the baby move 1 times today. Patient states she felt the baby when she got to the PACU bay. Patient has been up since 0200 with nausea and vomiting. [facility] No interventions were done. Patient able to drink some water and just ate around 0730    Past Medical History:   Diagnosis Date    Anemia     Headache     uses tylenol for migraines    Hx MRSA infection 2018    Supraventricular tachycardia during pregnancy        Pt reports some abdominal cramping. . No   LOF, No   vaginal bleeding. Patient has some maternal tachycardia during pregnancy      Pt oriented to room. Monitors applied. Pt informed that we need to monitor for at least 257ms. Pt accompanied by husband

## 2021-06-17 LAB — GROUP B STREP TRANSCRIBED: GBS Transcribed: NEGATIVE

## 2021-06-20 ENCOUNTER — Observation Stay
Admission: RE | Admit: 2021-06-20 | Discharge: 2021-06-21 | Disposition: A | Discharge status: Home / Self Care | Payer: PRIVATE HEALTH INSURANCE | Source: Ambulatory Visit | Attending: Obstetrics & Gynecology | Admitting: Obstetrics & Gynecology

## 2021-06-20 ENCOUNTER — Encounter: Payer: Self-pay | Admitting: Obstetrics & Gynecology

## 2021-06-20 DIAGNOSIS — Z3A36 36 weeks gestation of pregnancy: Secondary | ICD-10-CM

## 2021-06-20 DIAGNOSIS — O26893 Other specified pregnancy related conditions, third trimester: Principal | ICD-10-CM | POA: Insufficient documentation

## 2021-06-20 DIAGNOSIS — R519 Headache, unspecified: Secondary | ICD-10-CM

## 2021-06-20 MED ORDER — ACETAMINOPHEN 325 MG PO TABS
650.0000 mg | ORAL_TABLET | ORAL | Status: DC | PRN
Start: 2021-06-20 — End: 2021-06-21

## 2021-06-20 MED ORDER — ACETAMINOPHEN 650 MG RE SUPP
650.0000 mg | RECTAL | Status: DC | PRN
Start: 2021-06-20 — End: 2021-06-21

## 2021-06-20 MED ORDER — ALUM & MAG HYDROXIDE-SIMETH 200-200-20 MG/5ML PO SUSP
30.0000 mL | Freq: Four times a day (QID) | ORAL | Status: DC | PRN
Start: 2021-06-20 — End: 2021-06-21

## 2021-06-20 MED ORDER — PROMETHAZINE HCL 25 MG PO TABS
12.5000 mg | ORAL_TABLET | Freq: Four times a day (QID) | ORAL | Status: DC | PRN
Start: 2021-06-20 — End: 2021-06-21
  Administered 2021-06-20: 12.5 mg via ORAL
  Filled 2021-06-20: qty 1

## 2021-06-20 MED ORDER — PROMETHAZINE HCL 25 MG RE SUPP
12.5000 mg | Freq: Four times a day (QID) | RECTAL | Status: DC | PRN
Start: 2021-06-20 — End: 2021-06-21

## 2021-06-20 MED ORDER — ONDANSETRON HCL 4 MG/2ML IJ SOLN
4.0000 mg | Freq: Three times a day (TID) | INTRAMUSCULAR | Status: DC | PRN
Start: 2021-06-20 — End: 2021-06-21

## 2021-06-20 MED ORDER — SUMATRIPTAN SUCCINATE 50 MG PO TABS
50.0000 mg | ORAL_TABLET | ORAL | Status: DC | PRN
Start: 2021-06-20 — End: 2021-06-21
  Administered 2021-06-20 – 2021-06-21 (×2): 50 mg via ORAL
  Filled 2021-06-20 (×2): qty 1

## 2021-06-20 MED ORDER — ONDANSETRON 4 MG PO TBDP
4.0000 mg | ORAL_TABLET | Freq: Three times a day (TID) | ORAL | Status: DC | PRN
Start: 2021-06-20 — End: 2021-06-21

## 2021-06-20 MED ORDER — DIPHENHYDRAMINE HCL 25 MG PO CAPS
25.0000 mg | ORAL_CAPSULE | Freq: Once | ORAL | Status: AC
Start: 2021-06-20 — End: 2021-06-20
  Administered 2021-06-20: 25 mg via ORAL
  Filled 2021-06-20: qty 1

## 2021-06-20 MED ORDER — SOD CITRATE-CITRIC ACID 500-334 MG/5ML PO SOLN
30.0000 mL | Freq: Once | ORAL | Status: DC | PRN
Start: 2021-06-20 — End: 2021-06-21

## 2021-06-20 MED ORDER — LACTATED RINGERS IV BOLUS
1000.0000 mL | Freq: Once | INTRAVENOUS | Status: AC
Start: 2021-06-20 — End: 2021-06-20
  Administered 2021-06-20: 1000 mL via INTRAVENOUS

## 2021-06-20 MED ORDER — CALCIUM CARBONATE ANTACID 500 MG PO CHEW
1000.0000 mg | CHEWABLE_TABLET | Freq: Three times a day (TID) | ORAL | Status: DC | PRN
Start: 2021-06-20 — End: 2021-06-21

## 2021-06-20 MED ORDER — SODIUM CHLORIDE 0.9 % IV SOLN
6.2500 mg | Freq: Four times a day (QID) | INTRAVENOUS | Status: DC | PRN
Start: 2021-06-20 — End: 2021-06-21
  Administered 2021-06-20: 6.25 mg via INTRAVENOUS
  Filled 2021-06-20: qty 6.25

## 2021-06-20 MED ORDER — NALBUPHINE HCL 10 MG/ML SYRINGE
10.0000 mg | INTRAMUSCULAR | Status: DC | PRN
Start: 2021-06-20 — End: 2021-06-21

## 2021-06-20 MED ORDER — MAGNESIUM OXIDE 400 MG TABS (WRAP)
800.0000 mg | ORAL_TABLET | Freq: Every day | ORAL | Status: AC
Start: 2021-06-20 — End: 2021-06-20
  Administered 2021-06-20: 800 mg via ORAL
  Filled 2021-06-20: qty 2

## 2021-06-20 NOTE — Progress Notes (Signed)
Pt reports pain has decreased to  6/10.  Pt states she is able  to do normal activities at pain level 5/10.  Pt is resting/dozing and without ctx.  Baby active with 130 baseline and accels noted.  IV saline locked.

## 2021-06-20 NOTE — Progress Notes (Signed)
Labor and Delivery Admit Note    Patient name: Christina Reilly    Subjective:   Christina Reilly is a 21 y.o. G2P0010 female at [redacted]w[redacted]d Estimated Date of Delivery: 07/16/21.  She is being admitted for observation.  Significant factors related to the current pregnancy include: headache history og migraines and now since monday and has seen the eye doctor to be evlauated and the eyes are OK. She has taken '4000mg'$  of tylenol today..  Patient reports headache.  She denies vaginal bleeding and leakage of fluid.  Fetal Movement: normal.    Review of Systems:  All other systems were reviewed and are negative except as previously noted in the HPI.    Objective:    Vital signs in last 24 hours:  Temp:  [97.8 F (36.6 C)] 97.8 F (36.6 C)  Heart Rate:  [113] 113  BP: (115)/(62) 115/62     FHTS: reactive baseline 140  Toco:  no ctx  General:   alert, appears stated age, and cooperative   Lungs:   clear to auscultation bilaterally   Heart:   regular rate and rhythm   Abdomen:   gravid, non-tender   SVE:  Deferred as no ctx     Lab Review:  Lab Results   Component Value Date    ABORH AB NEG 06/13/2021        Assessment:  21y.o. GG100P0010female at 347w2dNot in labor.    Problem List:  Patient Active Problem List   Diagnosis    Abdominal pain affecting pregnancy    Nausea and vomiting during pregnancy    Palpitation    Decreased fetal movement    Headache       Plan:  Admit to L&D  Anticipate discharge home after eval the headaches     Risks, benefits, alternatives and possible complications have been discussed in detail with the patient.  All questions answered and appropriate consents     Historical Data:  PAST MEDICAL HISTORY  Past Medical History:   Diagnosis Date    Anemia     Headache     uses tylenol for migraines    Hx MRSA infection 2018    Supraventricular tachycardia during pregnancy      PAST SURGICAL HISTORY  Past Surgical History:   Procedure Laterality Date    TONSILLECTOMY  2022    WISDOM TOOTH EXTRACTION  2020     OB  History   Gravida Para Term Preterm AB Living   2       1     SAB IAB Ectopic Multiple Live Births   1              # Outcome Date GA Lbr Len/2nd Weight Sex Delivery Anes PTL Lv   2 Current            1 SAB 03/2020             MEDS  No current facility-administered medications on file prior to encounter.     Current Outpatient Medications on File Prior to Encounter   Medication Sig Dispense Refill    Prenatal MV-Min-Fe Fum-FA-DHA (PRENATAL 1 PO) Take 1 tablet by mouth daily       ALLERGIES  Allergies   Allergen Reactions    Doxycycline Hives     FAMILY HISTORY  Family History   Problem Relation Age of Onset    Diabetes Mother     COPD Maternal Grandmother  Diabetes Maternal Grandmother      SOCIAL HISTORY  Social History     Occupational History    Not on file   Tobacco Use    Smoking status: Never    Smokeless tobacco: Never   Vaping Use    Vaping status: Never Used   Substance and Sexual Activity    Alcohol use: Never    Drug use: Never    Sexual activity: Yes     Partners: Male     Plan   Will give IVF as pulse is 113  She will be given po mag oxide and then phenergan for the diarrhea IV and Po to see if that helps the HA.  She has had imitrex in the past and it worked  HA due to dairy in the 2018 and then stopped when she changed her diet. Saw a PCP recently who said to get eyes chkd she did and they were normal  Will need further workup if this does not break.  She says pain is now 7/10 down form 8 with the mag and the fluids.    We can also give nubain to break the cycle if these do not work.

## 2021-06-20 NOTE — Progress Notes (Signed)
IV started and bolus started.  Pt states she feels no different and that her HA is still severe.  Pt reports having a history of migraines in 2018 related to her dietary intake of dairy.  FHR baseline 130-140s and no contractions noted. +FM

## 2021-06-20 NOTE — Progress Notes (Signed)
Pt HA has dropped from an 8 to a 7  Discussed her history with her  She last ate at 5p  She usually takes an allergy relief med that she has not taken for several days   The HA may be histamine related given the onset coincident with the smoke and haze.  Her BP is normal.  Offered Imitrex and benadryl now or nubain   Discussed risks with narcotics of dizzy ness and she will be sleepy which is good.  Then the benadryl can decrease the histamine and then the imitrex can help relieve the HA that has worked in the past.   UTD and other sources including pharmacy formulary of hospital say it is safe in pregnancy-no warning when ordering as occurs when nubain ordered. Theoretical risk of decreasing vasculature of placenta not likely at 3rd trimester. It will decrease the vessels of the brain.   She wants to try the imitrex.  We will give imitrex and 25 mg benadryl and ivf and see how she does.   Will monitor baby continuously  And send PCP note to be reeval.  Reactive tracing no decels pos accels   No ctx/     Vitals:    06/20/21 2014 06/20/21 2132   BP: 115/62 100/56   Pulse: (!) 113 (!) 102   Resp: 18 20   Temp: 97.8 F (36.6 C)    TempSrc: Oral    Weight: 77.1 kg (170 lb)    Height: 1.499 m ('4\' 11"'$ )      Dr Erik Obey

## 2021-06-20 NOTE — Progress Notes (Signed)
Pt is a 21yo G2P1 here for persistent HA unrelieved with Tylenol. Pt states she has spots and has been evaluated by her eye doctor and her "eyes are fine".  Pt denies LOF, VB, epigastric pain, and regular ctx.  Pt reports ctx q10m Pt placed on efmx2 and VSS.

## 2021-06-21 MED ORDER — SUMATRIPTAN SUCCINATE 50 MG PO TABS
50.0000 mg | ORAL_TABLET | Freq: Once | ORAL | Status: DC
Start: 2021-06-21 — End: 2021-06-21
  Filled 2021-06-21: qty 1

## 2021-06-21 NOTE — Progress Notes (Signed)
Pt with pain persisting at 5/10.  Dr Cristino Martes to bedside to evaluate.  MD would like patient to get another dose of Imitrex. MD to re-evaluate in 20 min to establish POC.  FHR reactive, +FM. No contractions noted; pt denies.

## 2021-06-21 NOTE — Progress Notes (Signed)
Review discharge instructions to patient and FOB. Pt states understanding and without questions at present time.  NST reactive.  Pt rates pain at 4-5/10 after second dose of Imitrex. Pt ambulate off floor in stable condition with her husband for discharge.

## 2021-06-21 NOTE — Progress Notes (Signed)
Other half of 100 mg dose given for HA now that she tolerated the first at 1 hour 15 minutes later.   She is talking freely and is not in apparent pain or distress.  Her HA is a t a 5   Note sent to PCP to see if we can get her on beta blockers or ssri for the headache.  Drink plenty of fluids and followup next week with dr Lolita Rieger   Pt lives 1 hour away an dis concerned that she might be in labor and wont make it here. She is reassured she is not in labor and we will not induce her at 36 weeks for a headache that she needs to get to at least 39 weeks for lung maturity.  Then we can get her to neurology   Reactive tracing no decels pos accels  No ctx   home to follow  up with dr Lolita Rieger early next week and to call to see PCP. If worsens can go to urgent care or call us but please always call the hotline for OB so we can help out

## 2021-06-21 NOTE — Discharge Instr - AVS First Page (Addendum)
Reason for your Hospital Admission:  HA that did not respond to Tylenol      Instructions for after your discharge:  Follow up with Dr Lolita Rieger as soon as possible. Dr Cristino Martes has sent a note regarding your admission tonight.      Call your doctor if:  Contractions become stronger and regular every 5-10 minutes for one hour.  Bag of water ruptures. (Sudden gush of fluid or continuous leak)  Vaginal bleeding occurs (bright red, running down legs or passed blood clots).    Spotting after a vaginal exam or mucous bloody show is normal.  You experience any unusually sharp abdominal pain.  You notice a decrease in fetal movement.  If you notice a decrease in the fetal movement.  Get something to eat and drink, lay on your side, place you had on your abdomen and perform your fetal kick counts.  Have this information available for your MD.         Kick Counts  It's normal to worry about your baby's health. One way you can know your baby's doing well is to record the baby's movements once a day. This is called a kick count. You will usually feel your baby move by the 20th week of pregnancy. Remember to take your kick count records to all your appointments with your healthcare provider.   How to Count Kicks  Choose a time when the baby is active, such as after a meal.   Sit comfortably or lie on your side.   The first time the baby moves, write down the time.   Count each movement until the baby has moved 10 times. This can take from 20 minutes to 2 hours.   If the baby hasn't moved 4 times in 1 hour, pat your stomach to wake the baby up.  Write down the time you feel the baby's 10th movement.  Try to do it at the same time each day.  When to Call Your Healthcare Provider  Call your healthcare provider right away if you notice any of the following:  Your baby moves fewer than 10 times in 4 hours while you're doing kick counts.  Your baby moves much less often than on the days before.  You have not felt your baby move all day.    [address] [place] [address]. All rights reserved. This information is not intended as a substitute for professional medical care. Always follow your healthcare professional's instructions.    Please call your provider if:    ~ you have regular contractions every 5-10 min for more than one hour or ____________  ~ your water breaks (sudden gush or continuous leak)  ~ you have vaginal bleeding (bright red, running down legs or large clots)  ~ you experience any unusually sharp abdominal pain  ~ you notice a decrease in fetal movement  ~ you have persistent severe headaches  ~ you have nausea and vomiting  ~ you have blurring vision or spots before your eyes  ~ you get a chills and fever of over 100.4 F     - the temperature is not accompanied by a cold  ~ you get sudden increased swelling in your hands, feet and face, or       - if suddenly your rings are too tight  ~ you have burning, urgency or frequency of urination  ~ you have any other questions or conserns    Vitals:  06/21/21 0031   BP: 103/58   Pulse: 92   Resp: 20   Temp:      No current facility-administered medications on file prior to encounter.     Current Outpatient Medications on File Prior to Encounter   Medication Sig Dispense Refill    Prenatal MV-Min-Fe Fum-FA-DHA (PRENATAL 1 PO) Take 1 tablet by mouth daily         Current Facility-Administered Medications:     acetaminophen (TYLENOL) tablet 650 mg, 650 mg, Oral, Q4H PRN **OR** acetaminophen (TYLENOL) suppository 650 mg, 650 mg, Rectal, Q4H PRN, Klett, Genia Hotter, MD    alum & mag hydroxide-simethicone (MAALOX PLUS) 200-200-20 mg/5 mL suspension 30 mL, 30 mL, Oral, Q6H PRN, Klett, Genia Hotter, MD    calcium carbonate (TUMS) chewable tablet 1,000 mg, 1,000 mg, Oral, TID PRN, Klett, Genia Hotter, MD    nalbuphine (NUBAIN) injection 10 mg, 10 mg, Intravenous, Q3H PRN, Klett, Carrie C, MD    ondansetron (ZOFRAN-ODT) disintegrating tablet 4 mg, 4 mg, Oral, Q8H PRN  **OR** ondansetron (ZOFRAN) injection 4 mg, 4 mg, Intravenous, Q8H PRN, Klett, Genia Hotter, MD    promethazine (PHENERGAN) tablet 12.5 mg, 12.5 mg, Oral, Q6H PRN, 12.5 mg at 06/20/21 2124 **OR** promethazine (PHENERGAN) 6.25 mg in sodium chloride 0.9 % 50 mL IVPB, 6.25 mg, Intravenous, Q6H PRN, Last Rate: 200 mL/hr at 06/20/21 2145, 6.25 mg at 06/20/21 2145 **OR** promethazine (PHENERGAN) suppository 12.5 mg, 12.5 mg, Rectal, Q6H PRN, Klett, Carrie C, MD    sodium citrate-citric acid (BICITRA) oral solution 30 mL, 30 mL, Oral, Once PRN, Klett, Genia Hotter, MD    SUMAtriptan (IMITREX) tablet 50 mg, 50 mg, Oral, Q2H PRN, Klett, Carrie C, MD, 50 mg at 06/21/21 0011    SUMAtriptan (IMITREX) tablet 50 mg, 50 mg, Oral, Once, Klett, Genia Hotter, MD

## 2021-06-21 NOTE — Progress Notes (Signed)
Pt reports pain has decreased to a 4/10.  Pt dozing.  FHR 130 baseline, Category 1. +FM.

## 2021-06-28 ENCOUNTER — Inpatient Hospital Stay
Admission: AD | Admit: 2021-06-28 | Discharge: 2021-07-03 | DRG: 807 | Disposition: A | Discharge status: Home / Self Care | Payer: PRIVATE HEALTH INSURANCE | Source: Ambulatory Visit | Attending: Obstetrics & Gynecology | Admitting: Obstetrics & Gynecology

## 2021-06-28 ENCOUNTER — Encounter: Payer: Self-pay | Admitting: Obstetrics & Gynecology

## 2021-06-28 DIAGNOSIS — O4292 Full-term premature rupture of membranes, unspecified as to length of time between rupture and onset of labor: Principal | ICD-10-CM | POA: Diagnosis present

## 2021-06-28 DIAGNOSIS — Z6731 Type AB blood, Rh negative: Secondary | ICD-10-CM

## 2021-06-28 DIAGNOSIS — Z3A37 37 weeks gestation of pregnancy: Secondary | ICD-10-CM

## 2021-06-28 DIAGNOSIS — O368130 Decreased fetal movements, third trimester, not applicable or unspecified: Secondary | ICD-10-CM | POA: Diagnosis not present

## 2021-06-28 LAB — CBC AND DIFFERENTIAL
Absolute NRBC: 0 10*3/uL (ref 0.00–0.00)
Basophils Absolute Automated: 0.02 10*3/uL (ref 0.00–0.08)
Basophils Automated: 0.2 %
Eosinophils Absolute Automated: 0.01 10*3/uL (ref 0.00–0.44)
Eosinophils Automated: 0.1 %
Hematocrit: 35.4 % (ref 34.7–43.7)
Hgb: 11.5 g/dL (ref 11.4–14.8)
Immature Granulocytes Absolute: 0.06 10*3/uL (ref 0.00–0.07)
Immature Granulocytes: 0.6 %
Instrument Absolute Neutrophil Count: 7.82 10*3/uL — ABNORMAL HIGH (ref 1.10–6.33)
Lymphocytes Absolute Automated: 1.61 10*3/uL (ref 0.42–3.22)
Lymphocytes Automated: 15.4 %
MCH: 25.7 pg (ref 25.1–33.5)
MCHC: 32.5 g/dL (ref 31.5–35.8)
MCV: 79 fL (ref 78.0–96.0)
MPV: 11.4 fL (ref 8.9–12.5)
Monocytes Absolute Automated: 0.91 10*3/uL — ABNORMAL HIGH (ref 0.21–0.85)
Monocytes: 8.7 %
Neutrophils Absolute: 7.82 10*3/uL — ABNORMAL HIGH (ref 1.10–6.33)
Neutrophils: 75 %
Nucleated RBC: 0 /100 WBC (ref 0.0–0.0)
Platelets: 209 10*3/uL (ref 142–346)
RBC: 4.48 10*6/uL (ref 3.90–5.10)
RDW: 17 % — ABNORMAL HIGH (ref 11–15)
WBC: 10.43 10*3/uL — ABNORMAL HIGH (ref 3.10–9.50)

## 2021-06-28 MED ORDER — SODIUM CHLORIDE 0.9 % IV SOLN
6.2500 mg | Freq: Four times a day (QID) | INTRAVENOUS | Status: DC | PRN
Start: 2021-06-28 — End: 2021-06-28

## 2021-06-28 MED ORDER — NALOXONE HCL 0.4 MG/ML IJ SOLN (WRAP)
0.2000 mg | INTRAMUSCULAR | Status: DC | PRN
Start: 2021-06-28 — End: 2021-07-01

## 2021-06-28 MED ORDER — ONDANSETRON HCL 4 MG/2ML IJ SOLN
4.0000 mg | Freq: Three times a day (TID) | INTRAMUSCULAR | Status: DC | PRN
Start: 2021-06-28 — End: 2021-07-01
  Administered 2021-07-01: 4 mg via INTRAVENOUS
  Filled 2021-06-28: qty 2

## 2021-06-28 MED ORDER — ACETAMINOPHEN 325 MG PO TABS
650.0000 mg | ORAL_TABLET | ORAL | Status: DC | PRN
Start: 2021-06-28 — End: 2021-07-01
  Administered 2021-06-30 – 2021-07-01 (×2): 650 mg via ORAL
  Filled 2021-06-28 (×3): qty 2

## 2021-06-28 MED ORDER — SOD CITRATE-CITRIC ACID 500-334 MG/5ML PO SOLN
30.0000 mL | Freq: Once | ORAL | Status: DC | PRN
Start: 2021-06-28 — End: 2021-07-01

## 2021-06-28 MED ORDER — MISOPROSTOL 25 MCG PO SPLIT TAB
50.0000 ug | ORAL_TABLET | Freq: Four times a day (QID) | ORAL | Status: DC
Start: 2021-06-28 — End: 2021-06-29
  Administered 2021-06-28 – 2021-06-29 (×3): 50 ug via BUCCAL
  Filled 2021-06-28 (×3): qty 2

## 2021-06-28 MED ORDER — ACETAMINOPHEN 650 MG RE SUPP
650.0000 mg | RECTAL | Status: DC | PRN
Start: 2021-06-28 — End: 2021-07-01

## 2021-06-28 MED ORDER — ALUM & MAG HYDROXIDE-SIMETH 200-200-20 MG/5ML PO SUSP
30.0000 mL | Freq: Four times a day (QID) | ORAL | Status: DC | PRN
Start: 2021-06-28 — End: 2021-07-01

## 2021-06-28 MED ORDER — TERBUTALINE SULFATE 1 MG/ML IJ SOLN
0.2500 mg | Freq: Once | INTRAMUSCULAR | Status: DC | PRN
Start: 2021-06-28 — End: 2021-07-01

## 2021-06-28 MED ORDER — CALCIUM CARBONATE ANTACID 500 MG PO CHEW
1000.0000 mg | CHEWABLE_TABLET | Freq: Three times a day (TID) | ORAL | Status: DC | PRN
Start: 2021-06-28 — End: 2021-07-01

## 2021-06-28 MED ORDER — PROMETHAZINE HCL 25 MG RE SUPP
12.5000 mg | Freq: Four times a day (QID) | RECTAL | Status: DC | PRN
Start: 2021-06-28 — End: 2021-06-28

## 2021-06-28 MED ORDER — ONDANSETRON 4 MG PO TBDP
4.0000 mg | ORAL_TABLET | Freq: Three times a day (TID) | ORAL | Status: DC | PRN
Start: 2021-06-28 — End: 2021-07-01

## 2021-06-28 MED ORDER — SODIUM CHLORIDE (PF) 0.9 % IJ SOLN
3.0000 mL | Freq: Three times a day (TID) | INTRAMUSCULAR | Status: DC
Start: 2021-06-28 — End: 2021-07-01
  Administered 2021-06-29 – 2021-06-30 (×2): 3 mL via INTRAVENOUS

## 2021-06-28 MED ORDER — PROMETHAZINE HCL 25 MG PO TABS
12.5000 mg | ORAL_TABLET | Freq: Four times a day (QID) | ORAL | Status: DC | PRN
Start: 2021-06-28 — End: 2021-06-28

## 2021-06-28 NOTE — H&P (Signed)
BILLIEJO HENRICH is a 21 yr old female  G2P0 at 3w2dwith SROM at 10am, clear fluid, and confirmed in office and vertex presentation.  Pt reports contractions but rare.  Uncomplicated antenatal course.  GBS Neg    Past Medical History:   Diagnosis Date    MIGRAINE     UNSPECIFIED ANXIETY      Past Surgical History:   Procedure Laterality Date    TONSILLECTOMY Bilateral 02/20/2020    Performed by SJonna Clark(M.D.), M.D. at [facility]     Allergies as of 06/28/2021  Reviewed by RAsa Saunas(M.D.), M.D. on 06/17/2021        Severity Noted Reaction Type Reactions    Augmentin [amoxicillin With Clavulanate Potassium] Not Specified 09/01/2016   Intolerance     vomiting    Phenergan [promethazine Hydrochloride] Not Specified 01/15/2021    Rash    Doxycycline Monohydrate Low 11/27/2016    Rash    Rash when on doxy; stopped, rash improved; restarted doxy; rash recurred: 2018          OB History   Gravida Para Term Preterm AB Living   2 0 0 0 1 0   SAB IAB Ectopic Molar Multiple Live Births   1 0 0 0 0 0       Labs:  AB neg , Rub Imm, HIV neg, Hep B neg, syphilis neg, GC CH neg, Var Imm, GBS Neg    Exam:  BP 135/61   Pulse (!) 123   Temp 99 F (37.2 C) (Oral)   Resp 15   Ht 1.499 m ('4\' 11"'$ )   Wt 79.4 kg (175 lb)   BMI 35.35 kg/m   Abd: gravid, NT  FHR: Cat 1, mod var, no decesl, +accels  Toco: rare  SVE: office  0 / 50% / -3  Ext: no edema    A/P:  Admit for SROM  Will augment with miso, explained and pt and husband agree, and pt to walk during cervical ripening  Risks for vaginal delivery or cesarean reviewed to include but not limited to infection, bleeding, bleeding to blood transfusion, injury to adjacent organs (bladder, intestines, nerves, blood vessels, ureters, and uterus) , injury to infant (head,brain, neck, shoulder, limbs),risk of blood clot,  and rare risk of death of infant and/or pt.  Expecting a boy, ESydnee Cabal

## 2021-06-28 NOTE — Plan of Care (Signed)
Problem: Vaginal/Cesarean Delivery  Goal: Evidence of Fetal Well Being  Flowsheets (Taken 06/28/2021 1644)  Evidence of fetal well being:   Monitor/assess fetal heart rate. Notify LIP of Category II or III EFM tracings.   Initiate interventions for Category II or III EFM strip   Patient's position should support labor progress and expulsion efforts   Position patient for maximum uterine perfusion

## 2021-06-28 NOTE — Progress Notes (Signed)
EFM's discontinued. Patient eating dinner.

## 2021-06-29 ENCOUNTER — Encounter: Payer: Self-pay | Admitting: Obstetrics & Gynecology

## 2021-06-29 LAB — TYPE AND SCREEN
AB Screen Gel: POSITIVE
ABO Rh: AB NEG

## 2021-06-29 LAB — ANTIBODY IDENTIFICATION - REFLEX: Antibody ID: POSITIVE

## 2021-06-29 MED ORDER — MISOPROSTOL 25 MCG PO SPLIT TAB
50.0000 ug | ORAL_TABLET | Freq: Four times a day (QID) | ORAL | Status: AC
Start: 2021-06-29 — End: 2021-06-30
  Administered 2021-06-29 – 2021-06-30 (×2): 50 ug via BUCCAL
  Filled 2021-06-29 (×2): qty 2

## 2021-06-29 MED ORDER — FENTANYL-BUPIVACAINE-NACL 0.2-0.125-0.9 MG/100ML-% EP SOLN
EPIDURAL | Status: DC
Start: 2021-06-29 — End: 2021-07-03
  Administered 2021-06-30: 10 mL/h via EPIDURAL
  Filled 2021-06-29 (×4): qty 100

## 2021-06-29 MED ORDER — OXYTOCIN-SODIUM CHLORIDE 30-0.9 UT/500ML-% IV SOLN
2.0000 m[IU]/min | INTRAVENOUS | Status: DC
Start: 2021-06-29 — End: 2021-07-01
  Administered 2021-06-29 – 2021-06-30 (×2): 2 m[IU]/min via INTRAVENOUS
  Administered 2021-07-01: 30 m[IU]/min via INTRAVENOUS
  Administered 2021-07-01: 7.5 m[IU]/min via INTRAVENOUS
  Filled 2021-06-29 (×4): qty 500

## 2021-06-29 MED ORDER — LACTATED RINGERS IV BOLUS
1000.0000 mL | Freq: Once | INTRAVENOUS | Status: DC
Start: 2021-06-29 — End: 2021-07-01

## 2021-06-29 MED ORDER — LACTATED RINGERS IV SOLN
INTRAVENOUS | Status: DC
Start: 2021-06-29 — End: 2021-07-01
  Administered 2021-06-30: 990 mL via INTRAVENOUS

## 2021-06-29 NOTE — Progress Notes (Signed)
S: progress check.  Comfotable - no pain meds  O: Temp:  [97.5 F (36.4 C)-99 F (37.2 C)] 97.8 F (36.6 C)  Heart Rate:  [73-123] 85  Resp Rate:  [15-18] 18  BP: (106-135)/(59-75) 110/72   Oxytocin at 12  UC q3  FHT cat 1  VE def  A: 21 y.o. G2P0010 @ [redacted]w[redacted]d  PROM  Early labor  P: cont oxytocin

## 2021-06-29 NOTE — Plan of Care (Signed)
Problem: Vaginal/Cesarean Delivery  Goal: Maternal Status within defined parameters  Outcome: Progressing  Flowsheets (Taken 06/29/2021 1931)  Maternal status with defined parameters:   Monitor/assess vital signs   Perform physical assessment per phase of care   Manage complications/co-morbidities per LIP orders  Goal: Evidence of Fetal Well Being  Outcome: Progressing  Flowsheets (Taken 06/29/2021 1931)  Evidence of fetal well being:   Monitor/assess fetal heart rate. Notify LIP of Category II or III EFM tracings.   Position patient for maximum uterine perfusion   Patient's position should support labor progress and expulsion efforts   Initiate interventions for Category II or III EFM strip  Note: Ca 1 strip  Goal: Free from Maternal/Fetal Infection  Outcome: Progressing  Flowsheets (Taken 06/29/2021 1931)  Free from Maternal/Fetal Infection:   Assess for infection risk using the appropriate screening tool   Determine GBS status. If GBS positive, manage per LIP orders.  Note: GBS negative  Goal: Intrapartum management of pain/discomfort  Outcome: Progressing  Flowsheets (Taken 06/29/2021 1931)  Intrapartum management of pain/discomfort:   Keep pain at acceptable level for patient   Assess pain using a consistent, developmental/age appropriate pain scale   Monitor hemodynamic parameters in response to pain/sedation medications   Assess pain level before and following intervention   Assess and manage side effects associated with pain management   Include patient/patient care companion in decisions related to pain management   Support patient for natural childbirth with non-pharmacologic pain management or with desired pharmacologic management such as epidural   Report ineffective pain management to LIP   Consult/collaborate with Anesthesia or Pain Service  Note: Plans for epidural. Denies discomfort/pain at this time.

## 2021-06-29 NOTE — Progress Notes (Signed)
Report given to Andee Poles, RN. Care relinquished at this time.

## 2021-06-29 NOTE — Progress Notes (Signed)
Labor Check    S: Minimal CTX with cytotec    O:   Vitals:    06/29/21 0445   BP: 111/59   Pulse: 90   Resp: 18   Temp: 97.9 F (36.6 C)       The fetal heart tracing showed: Baseline Rate: 130 BPM, Variability: Moderate, Pattern: Accelerations, FHR Category: Category I        SVE     A/P Continue cytotec, recheck in am to see if suitable for pitocin      Moody Bruins, MD

## 2021-06-29 NOTE — Progress Notes (Signed)
Report received from Amy.Care assumed

## 2021-06-29 NOTE — Progress Notes (Signed)
S: progress check. Feeling UC stronger now  O: BP 114/77   Pulse 90   Temp 97.9 F (36.6 C) (Oral)   Resp 18   Ht 1.499 m ('4\' 11"'$ )   Wt 79.4 kg (175 lb)   BMI 35.35 kg/m   UC q3-4  FHT cat 1  VE 1/50/-2/mid/med  A: 21 y.o. G2P0010 @ [redacted]w[redacted]d PROM 10A yesterday  Early labor  P: cont increase oxytocin

## 2021-06-29 NOTE — Progress Notes (Signed)
Report given to Amy, RN. Care relinquished at this time.

## 2021-06-29 NOTE — Anesthesia Preprocedure Evaluation (Signed)
Anesthesia Evaluation    AIRWAY    Mallampati: III    TM distance: >3 FB  Neck ROM: full  Mouth Opening:full   CARDIOVASCULAR    cardiovascular exam normal       DENTAL    no notable dental hx               PULMONARY    pulmonary exam normal     OTHER FINDINGS                                  G2P0 at 43w2dwith SROM at 10am, clear fluid, and confirmed in office and vertex presentation.    Relevant Problems   NEURO/PSYCH   (+) Headache     Lab Results   Component Value Date    WBC 10.43 (H) 06/28/2021    HGB 11.5 06/28/2021    HCT 35.4 06/28/2021    MCV 79.0 06/28/2021    PLT 209 06/28/2021     Pt reports episodes of sinus tachycardia during pregnancy up to 140s; workup negative.          Anesthesia Plan    ASA 2     epidural               ((Risks/benefits/alternatives of epidural anesthesia discussed with patient including but not limited to bleeding, infection, hypotension, nerve injury, need for epidural replacement and use of epidural for Cesarean section or possible conversion to general anesthesia.)  )      Detailed anesthesia plan: epidural            informed consent obtained    Plan discussed with attending.                   Signed by: MOtho Najjar MD 06/29/21 6:24 AM

## 2021-06-29 NOTE — Progress Notes (Signed)
Took over the patient from Eartha Inch, RN and resume to care of the patient.

## 2021-06-29 NOTE — Progress Notes (Signed)
Dr. Dillard Cannon to bedside.  POC reviewed.  Pt verbalized good understanding.  Will begin pitocin induction.  Orders received.

## 2021-06-29 NOTE — Progress Notes (Signed)
S:  Dorla Aloria Cancienne is a 21 y.o. at [redacted]w[redacted]d comfortable, not really feeling ctx  O:  BP 121/76   Pulse 89   Temp 98.1 F (36.7 C) (Oral)   Resp 18   Ht 1.499 m ('4\' 11"'$ )   Wt 79.4 kg (175 lb)   BMI 35.35 kg/m   Exam   Cervix:  FT/50/-3  EFM:  Cat 1  Toco:  irregular  A/P:  345w4dIOL for PROM  -Fetus:  Cat 1 tracing  -No HTN/DM   -GBS neg  -augmentation/induction:  has been on pit x 12 hrs without change.  Offered pit to 1/2 and restart increasing vs miso x 2 more doses as has only had 3.  As cervix still unfavorable, recommend trial of miso and pt open to this.  Will give break x 1 hr to shower and have snack and then resume.  -pain management - as desired  -continue to monitor  -anticipate SVD  -pt and partner voice understanding    Completed by:  JuBridget HartshornMD, June 29, 2021, 9:05 PM

## 2021-06-29 NOTE — Progress Notes (Signed)
S: care rec from Dr. Norberto Sorenson.  Starting to feel cramps. Last miso 0440  O: Temp:  [97.5 F (36.4 C)-99 F (37.2 C)] 97.7 F (36.5 C)  Heart Rate:  [73-123] 73  Resp Rate:  [15-18] 18  BP: (106-135)/(59-73) 111/73   UC irreg  FHT cat 1  VE def  A: 21 y.o. G2P0010 @ [redacted]w[redacted]d PROM s/p cervical ripening  P: start oxytocin  Pt in agreement with plan.

## 2021-06-29 NOTE — Progress Notes (Signed)
Report given to [place], care of pt relinquished.

## 2021-06-30 ENCOUNTER — Encounter: Payer: Self-pay | Admitting: Student in an Organized Health Care Education/Training Program

## 2021-06-30 MED ORDER — MISOPROSTOL 25 MCG PO SPLIT TAB
50.0000 ug | ORAL_TABLET | Freq: Four times a day (QID) | ORAL | Status: AC
Start: 2021-06-30 — End: 2021-06-30
  Administered 2021-06-30: 50 ug via ORAL
  Filled 2021-06-30: qty 2

## 2021-06-30 MED ORDER — LIDOCAINE HCL (PF) 1 % IJ SOLN
INTRAMUSCULAR | Status: DC | PRN
Start: 2021-06-30 — End: 2021-07-01
  Administered 2021-06-30: 3 mL via SUBCUTANEOUS

## 2021-06-30 MED ORDER — LIDOCAINE-EPINEPHRINE 2 %-1:200000 IJ SOLN
INTRAMUSCULAR | Status: DC | PRN
Start: 2021-06-30 — End: 2021-07-01
  Administered 2021-06-30: 3 mL via EPIDURAL
  Administered 2021-07-01: 5 mL via EPIDURAL

## 2021-06-30 MED ORDER — BUPIVACAINE HCL (PF) 0.25 % IJ SOLN
INTRAMUSCULAR | Status: DC | PRN
Start: 2021-06-30 — End: 2021-07-01
  Administered 2021-06-30: 5 mL via EPIDURAL
  Administered 2021-07-01: 6 mL via EPIDURAL
  Administered 2021-07-01: 3 mL via EPIDURAL

## 2021-06-30 MED ORDER — MISOPROSTOL 25 MCG PO SPLIT TAB
ORAL_TABLET | ORAL | Status: AC
Start: 2021-06-30 — End: 2021-06-30
  Filled 2021-06-30: qty 2

## 2021-06-30 NOTE — Progress Notes (Signed)
Pt sitting up eating breakfast. Pt wanting to use the bathroom as well. Monitors unplugged. Categroy 1 FHR tracing prior to removal

## 2021-06-30 NOTE — Progress Notes (Signed)
Report given to Mady Gemma, Therapist, sports. Pt care relinquished at this time.

## 2021-06-30 NOTE — Progress Notes (Signed)
L&D Note:    Patient has no complaint. She is comfortable with epidural.    87       116/63    FHT 120 cat 1   Toco ctx q 3 to 5 minutes    Pitocin at 12 mu/min    Cervix 70/1/-2, vertex    Continue to increase Pitocin and titrate prn.  Consider IUPC prn.

## 2021-06-30 NOTE — Progress Notes (Signed)
S:  Christina Reilly is a 21 y.o. at [redacted]w[redacted]d PROM  O:  BP 121/76   Pulse 89   Temp 98 F (36.7 C) (Oral)   Resp 18   Ht 1.499 m ('4\' 11"'$ )   Wt 79.4 kg (175 lb)   BMI 35.35 kg/m   Exam   Cervix:  def  EFM:  cat 1  Toco:  irregular   A/P:  384w5dPROM  -Fetus:  Cat 1 tracing  -no HTN/DM   -GBS merg  -augmentation/induction:  will try next dose of miso vaginally as pt with rare ctx still   -pain management - prn   -continue to monitor  -anticipate svd    Completed by:  JuBridget HartshornMD, June 30, 2021, 2:01 AM

## 2021-06-30 NOTE — Progress Notes (Signed)
Took over the patient from Brantley Stage, RN and resume to care of the patient.

## 2021-06-30 NOTE — Anesthesia Procedure Notes (Signed)
Epidural    Patient location during procedure: L&D  Reason for block: Labor or C-section  Block at Surgeon's request: Yes    Start time: 06/30/2021 7:42 PM  End time: 06/30/2021 7:46 PM    Staffing  Performed: anesthesiologist   Anesthesiologist: Darral Dash, MD  Performed by: Darral Dash, MD  Authorized by: Darral Dash, MD      Pre-procedure Checklist   Completed: patient identified, site marked, surgical consent, pre-op evaluation, timeout performed, risks and benefits discussed, monitors and equipment checked, anesthesia consent given and correct site  Timeout Completed:  06/30/2021 7:33 PM    Epidural  Patient monitoring: Pulse oximetry and NIBP    Premedication: No  Patient position: sitting    Skin Local: lidocaine 1%  Dose: 3 mL    Attempts  Number of attempts: 1                    Successful attempt  Interspace: T3-4  Approach: midline    Needle type: Touhy needle   Needle gauge: 17  Injection technique: LOR air  Epidural Space ID: 7 cm  CSF Return: No   Blood Return: No  Paresthesia Pain: No    Needle Placement  Needle type: Touhy needle   Needle gauge: 17  Injection technique: LOR air  CSF Return: No  Blood Return: No          Paresthesia Pain: No    Catheter Placement   Catheter type: side hole  Catheter size: 19 G  Catheter at skin depth: 12 cm  CSF Return: No  Blood Return: No  Test Dose:other    Incremental injection: yes  Injection made incrementally with aspirations every 2 mL.    No Catheter IV/SA Signs or Symptoms    Assessment   Sensory level: Bilateral  Patient tolerated procedure well: Yes  Block Outcome: no complications and successful block

## 2021-06-30 NOTE — Progress Notes (Signed)
S:  Khloie Kassia Staves is a 21 y.o. at [redacted]w[redacted]d feeling occ cramping, but able to rest  O:  BP 121/76   Pulse 89   Temp 97.8 F (36.6 C) (Oral)   Resp 18   Ht 1.499 m ('4\' 11"'$ )   Wt 79.4 kg (175 lb)   BMI 35.35 kg/m   Exam   Cervix:  FT/50/-3  EFM:  Cat 1  Toco:  irregular  A/P:  353w5dPROM IOL  -Fetus:  Cat 1 tracing  -No HTN/DM   -GBS neg  -augmentation/induction:  miso X5 placed now - vaginally  -pain management - as desired  -continue to monitor  -anticipate SVD    Completed by:  JuBridget HartshornMD, June 30, 2021, 4:25 AM

## 2021-06-30 NOTE — Progress Notes (Signed)
Dr. Haywood Lasso to bedside to discuss POC. Will stop pitocin. Okay to be off monitor for shower.

## 2021-06-30 NOTE — Plan of Care (Signed)
Problem: Vaginal/Cesarean Delivery  Goal: Maternal Status within defined parameters  Outcome: Progressing  Flowsheets (Taken 06/30/2021 0820)  Maternal status with defined parameters:   Monitor/assess vital signs   Perform physical assessment per phase of care   Manage complications/co-morbidities per LIP orders  Goal: Evidence of Fetal Well Being  Outcome: Progressing  Flowsheets (Taken 06/30/2021 0820)  Evidence of fetal well being:   Monitor/assess fetal heart rate. Notify LIP of Category II or III EFM tracings.   Position patient for maximum uterine perfusion   Initiate interventions for Category II or III EFM strip   Patient's position should support labor progress and expulsion efforts  Goal: Free from Maternal/Fetal Infection  Outcome: Progressing  Flowsheets (Taken 06/30/2021 0820)  Free from Maternal/Fetal Infection:   Assess for infection risk using the appropriate screening tool   Determine GBS status. If GBS positive, manage per LIP orders.  Note: GBS neg  Goal: Intrapartum management of pain/discomfort  Outcome: Progressing  Flowsheets (Taken 06/30/2021 0820)  Intrapartum management of pain/discomfort:   Keep pain at acceptable level for patient   Assess pain using a consistent, developmental/age appropriate pain scale   Assess pain level before and following intervention   Monitor hemodynamic parameters in response to pain/sedation medications   Assess and manage side effects associated with pain management   Include patient/patient care companion in decisions related to pain management   Support patient for natural childbirth with non-pharmacologic pain management or with desired pharmacologic management such as epidural   Report ineffective pain management to LIP   Consult/collaborate with Anesthesia or Pain Service  Note: Pt resting comfortably

## 2021-06-30 NOTE — Progress Notes (Signed)
Pt can order regular diet for breakfast and then we will give #6 50 mcg of miso.  Then we will start pit. Just recently she has started having some irritability and feeling some cramping. I am encouraged. We will start pit 4-6 hours after the last miso and antiociapote SVD  CAT 1 tracing  Pos accels  No decels  Baseline 140  Anticipate SVD

## 2021-07-01 ENCOUNTER — Encounter: Payer: Self-pay | Admitting: Obstetrics

## 2021-07-01 LAB — CBC
Absolute NRBC: 0 10*3/uL (ref 0.00–0.00)
Hematocrit: 34.7 % (ref 34.7–43.7)
Hgb: 11.2 g/dL — ABNORMAL LOW (ref 11.4–14.8)
MCH: 25.5 pg (ref 25.1–33.5)
MCHC: 32.3 g/dL (ref 31.5–35.8)
MCV: 79 fL (ref 78.0–96.0)
MPV: 11.5 fL (ref 8.9–12.5)
Nucleated RBC: 0 /100 WBC (ref 0.0–0.0)
Platelets: 186 10*3/uL (ref 142–346)
RBC: 4.39 10*6/uL (ref 3.90–5.10)
RDW: 16 % — ABNORMAL HIGH (ref 11–15)
WBC: 12.55 10*3/uL — ABNORMAL HIGH (ref 3.10–9.50)

## 2021-07-01 LAB — ANTIBODY IDENTIFICATION - REFLEX: Antibody ID: POSITIVE

## 2021-07-01 LAB — TYPE AND SCREEN
AB Screen Gel: POSITIVE
ABO Rh: AB NEG

## 2021-07-01 MED ORDER — NALOXONE HCL 0.4 MG/ML IJ SOLN (WRAP)
0.2000 mg | INTRAMUSCULAR | Status: DC | PRN
Start: 2021-07-01 — End: 2021-07-03

## 2021-07-01 MED ORDER — PROMETHAZINE HCL 25 MG PO TABS
12.5000 mg | ORAL_TABLET | Freq: Four times a day (QID) | ORAL | Status: DC | PRN
Start: 2021-07-01 — End: 2021-07-03

## 2021-07-01 MED ORDER — BENZOCAINE 20% +/- MENTHOL 0.5% EX AERO (WRAP)
1.0000 | INHALATION_SPRAY | CUTANEOUS | Status: DC | PRN
Start: 2021-07-01 — End: 2021-07-03
  Administered 2021-07-02: 1 via TOPICAL
  Filled 2021-07-01: qty 56

## 2021-07-01 MED ORDER — ACETAMINOPHEN 650 MG RE SUPP
650.0000 mg | RECTAL | Status: DC | PRN
Start: 2021-07-01 — End: 2021-07-03

## 2021-07-01 MED ORDER — TRANEXAMIC ACID-NACL 1000-0.7 MG/100ML-% IV SOLN
INTRAVENOUS | Status: AC
Start: 2021-07-01 — End: 2021-07-01
  Administered 2021-07-01: 1000 mg
  Filled 2021-07-01: qty 100

## 2021-07-01 MED ORDER — MISOPROSTOL 200 MCG PO TABS
ORAL_TABLET | ORAL | Status: DC
Start: 2021-07-01 — End: 2021-07-01
  Filled 2021-07-01: qty 1

## 2021-07-01 MED ORDER — PROMETHAZINE HCL 25 MG RE SUPP
12.5000 mg | Freq: Four times a day (QID) | RECTAL | Status: DC | PRN
Start: 2021-07-01 — End: 2021-07-03

## 2021-07-01 MED ORDER — MISOPROSTOL 200 MCG PO TABS
800.0000 ug | ORAL_TABLET | Freq: Once | ORAL | Status: DC | PRN
Start: 2021-07-01 — End: 2021-07-03

## 2021-07-01 MED ORDER — MAGNESIUM HYDROXIDE 400 MG/5ML PO SUSP
30.0000 mL | Freq: Four times a day (QID) | ORAL | Status: DC | PRN
Start: 2021-07-01 — End: 2021-07-03

## 2021-07-01 MED ORDER — BISACODYL 10 MG RE SUPP
10.0000 mg | Freq: Every day | RECTAL | Status: DC | PRN
Start: 2021-07-01 — End: 2021-07-03

## 2021-07-01 MED ORDER — ONDANSETRON HCL 4 MG/2ML IJ SOLN
4.0000 mg | Freq: Three times a day (TID) | INTRAMUSCULAR | Status: DC | PRN
Start: 2021-07-01 — End: 2021-07-03

## 2021-07-01 MED ORDER — IBUPROFEN 600 MG PO TABS
600.0000 mg | ORAL_TABLET | Freq: Once | ORAL | Status: AC | PRN
Start: 2021-07-01 — End: 2021-07-01
  Administered 2021-07-01: 600 mg via ORAL
  Filled 2021-07-01: qty 1

## 2021-07-01 MED ORDER — METOCLOPRAMIDE HCL 5 MG/ML IJ SOLN
10.0000 mg | Freq: Once | INTRAMUSCULAR | Status: DC | PRN
Start: 2021-07-01 — End: 2021-07-01

## 2021-07-01 MED ORDER — WITCH HAZEL EX PADS (WRAP)
1.0000 | MEDICATED_PAD | CUTANEOUS | Status: DC | PRN
Start: 2021-07-01 — End: 2021-07-03
  Administered 2021-07-02: 1 via TOPICAL
  Filled 2021-07-01: qty 40

## 2021-07-01 MED ORDER — EPHEDRINE SULFATE 50 MG/ML IJ/IV SOLN (WRAP)
10.0000 mg | Freq: Once | Status: DC | PRN
Start: 2021-07-01 — End: 2021-07-01

## 2021-07-01 MED ORDER — TETANUS-DIPHTH-ACELL PERTUSSIS 5-2.5-18.5 LF-MCG/0.5 IM SUSP/SUSY (WR)
0.5000 mL | INTRAMUSCULAR | Status: DC | PRN
Start: 2021-07-01 — End: 2021-07-03

## 2021-07-01 MED ORDER — METHYLERGONOVINE MALEATE 0.2 MG/ML IJ SOLN
0.2000 mg | Freq: Four times a day (QID) | INTRAMUSCULAR | Status: DC | PRN
Start: 2021-07-01 — End: 2021-07-03

## 2021-07-01 MED ORDER — OXYCODONE HCL 5 MG PO TABS
5.0000 mg | ORAL_TABLET | ORAL | Status: DC | PRN
Start: 2021-07-01 — End: 2021-07-03

## 2021-07-01 MED ORDER — LIDOCAINE-EPINEPHRINE 1.5 %-1:200000 IJ SOLN
INTRAMUSCULAR | Status: DC | PRN
Start: 2021-07-01 — End: 2021-07-01
  Administered 2021-07-01: 5 mL via EPIDURAL

## 2021-07-01 MED ORDER — NIFEDIPINE 10 MG PO CAPS
10.0000 mg | ORAL_CAPSULE | Freq: Once | ORAL | Status: DC | PRN
Start: 2021-07-01 — End: 2021-07-03

## 2021-07-01 MED ORDER — ONDANSETRON 4 MG PO TBDP
4.0000 mg | ORAL_TABLET | Freq: Three times a day (TID) | ORAL | Status: DC | PRN
Start: 2021-07-01 — End: 2021-07-03

## 2021-07-01 MED ORDER — METHYLERGONOVINE MALEATE 0.2 MG/ML IJ SOLN
0.2000 mg | INTRAMUSCULAR | Status: DC | PRN
Start: 2021-07-01 — End: 2021-07-03

## 2021-07-01 MED ORDER — IBUPROFEN 600 MG PO TABS
600.0000 mg | ORAL_TABLET | Freq: Four times a day (QID) | ORAL | Status: DC
Start: 2021-07-02 — End: 2021-07-03
  Administered 2021-07-02 – 2021-07-03 (×5): 600 mg via ORAL
  Filled 2021-07-01 (×5): qty 1

## 2021-07-01 MED ORDER — SODIUM CHLORIDE 0.9 % IV SOLN
6.2500 mg | Freq: Four times a day (QID) | INTRAVENOUS | Status: DC | PRN
Start: 2021-07-01 — End: 2021-07-03

## 2021-07-01 MED ORDER — OXYCODONE HCL 5 MG PO TABS
5.0000 mg | ORAL_TABLET | Freq: Once | ORAL | Status: DC | PRN
Start: 2021-07-01 — End: 2021-07-03

## 2021-07-01 MED ORDER — PRENATAL PLUS IRON 29-1 MG PO TABS
1.0000 | ORAL_TABLET | Freq: Every day | ORAL | Status: DC
Start: 2021-07-02 — End: 2021-07-03
  Administered 2021-07-02 – 2021-07-03 (×2): 1 via ORAL
  Filled 2021-07-01 (×2): qty 1

## 2021-07-01 MED ORDER — NIFEDIPINE 10 MG PO CAPS
20.0000 mg | ORAL_CAPSULE | Freq: Once | ORAL | Status: DC | PRN
Start: 2021-07-01 — End: 2021-07-03

## 2021-07-01 MED ORDER — MEASLES, MUMPS & RUBELLA VAC IJ SOLR
0.5000 mL | INTRAMUSCULAR | Status: DC | PRN
Start: 2021-07-01 — End: 2021-07-03

## 2021-07-01 MED ORDER — SOD CITRATE-CITRIC ACID 500-334 MG/5ML PO SOLN
30.0000 mL | Freq: Once | ORAL | Status: DC | PRN
Start: 2021-07-01 — End: 2021-07-01

## 2021-07-01 MED ORDER — HYDROCORTISONE 1 % EX OINT
TOPICAL_OINTMENT | Freq: Three times a day (TID) | CUTANEOUS | Status: DC | PRN
Start: 2021-07-01 — End: 2021-07-03

## 2021-07-01 MED ORDER — ACETAMINOPHEN 325 MG PO TABS
650.0000 mg | ORAL_TABLET | ORAL | Status: DC | PRN
Start: 2021-07-01 — End: 2021-07-03
  Administered 2021-07-02 (×4): 650 mg via ORAL
  Filled 2021-07-01 (×5): qty 2

## 2021-07-01 MED ORDER — METHYLERGONOVINE MALEATE 0.2 MG PO TABS
0.2000 mg | ORAL_TABLET | Freq: Four times a day (QID) | ORAL | Status: DC | PRN
Start: 2021-07-01 — End: 2021-07-03

## 2021-07-01 MED ORDER — SENNOSIDES-DOCUSATE SODIUM 8.6-50 MG PO TABS
1.0000 | ORAL_TABLET | Freq: Every evening | ORAL | Status: DC | PRN
Start: 2021-07-01 — End: 2021-07-03
  Administered 2021-07-02: 1 via ORAL
  Filled 2021-07-01 (×2): qty 1

## 2021-07-01 MED ORDER — LANOLIN EX OINT
TOPICAL_OINTMENT | CUTANEOUS | Status: DC | PRN
Start: 2021-07-01 — End: 2021-07-03
  Filled 2021-07-01: qty 7

## 2021-07-01 MED ORDER — METHYLERGONOVINE MALEATE 0.2 MG/ML IJ SOLN
INTRAMUSCULAR | Status: AC
Start: 2021-07-01 — End: 2021-07-01
  Administered 2021-07-01: 0.2 mg
  Filled 2021-07-01: qty 1

## 2021-07-01 MED ORDER — LACTATED RINGERS IV BOLUS
1000.0000 mL | Freq: Once | INTRAVENOUS | Status: DC
Start: 2021-07-01 — End: 2021-07-01

## 2021-07-01 MED ORDER — NALOXONE HCL 0.4 MG/ML IJ SOLN (WRAP)
0.1000 mg | INTRAMUSCULAR | Status: DC | PRN
Start: 2021-07-01 — End: 2021-07-01

## 2021-07-01 MED ORDER — FAMOTIDINE 10 MG/ML IV SOLN (WRAP)
20.0000 mg | Freq: Once | INTRAVENOUS | Status: DC | PRN
Start: 2021-07-01 — End: 2021-07-01

## 2021-07-01 MED ORDER — MISOPROSTOL 200 MCG PO TABS
ORAL_TABLET | ORAL | Status: AC
Start: 2021-07-01 — End: 2021-07-01
  Administered 2021-07-01: 800 ug
  Filled 2021-07-01: qty 4

## 2021-07-01 NOTE — Progress Notes (Signed)
Report given to Vicente Males, Therapist, sports. Care relinquished.

## 2021-07-01 NOTE — Progress Notes (Signed)
Epidural replaced by Dr. Morton Stall for uncontrolled pain with boluses today.  Pain level is now 0 at 1745

## 2021-07-01 NOTE — Anesthesia Procedure Notes (Signed)
Epidural    Patient location during procedure: L&D  Reason for block: Labor or C-section  Block at Surgeon's request: Yes    Start time: 07/01/2021 5:37 PM  End time: 07/01/2021 5:50 PM    Staffing  Performed: anesthesiologist   Anesthesiologist: Lillia Corporal, MD  Performed by: Lillia Corporal, MD  Authorized by: Darral Dash, MD      Pre-procedure Checklist   Completed: patient identified, site marked, surgical consent, pre-op evaluation, timeout performed, risks and benefits discussed, monitors and equipment checked, anesthesia consent given and correct site  Timeout Completed:  07/01/2021 5:34 PM    Epidural  Patient monitoring: Pulse oximetry and NIBP    Premedication: No  Patient position: right lateral decubitus (patient too numb to maintain appropriate sitting position)    Skin Local: lidocaine 1%  Dose: 4 mL    Attempts  Number of attempts: 1                    Successful attempt  Interspace: L3-4  Approach: midline    Needle type: Touhy needle   Needle gauge: 17  Injection technique: LOR air and LOR saline  Epidural Space ID: 6 cm  CSF Return: No   Blood Return: No  Paresthesia Pain: No    Needle Placement  Needle type: Touhy needle   Needle gauge: 17  Injection technique: LOR air and LOR saline  CSF Return: No  Blood Return: No          Paresthesia Pain: No    Catheter Placement   Catheter type: side hole  Catheter size: 19 G  Catheter at skin depth: 11 cm  CSF Return: No  Blood Return: No  Test Dose:1.5 % lidocaine    Incremental injection: yes  Injection made incrementally with aspirations every 2 mL.    No Catheter IV/SA Signs or Symptoms    Assessment   Sensory level: Bilateral and T10  Patient tolerated procedure well: Yes  Block Outcome: no complications and pain improved  Additional Notes  Original epidural not controlling pain despite level to ice and multiple boluses. Discussed with patient and ob replacement. Patient agrees with plan.

## 2021-07-01 NOTE — Progress Notes (Signed)
Labor Progress    Subjective:   Chart reviewed. 21yo G2P0 admitted with prelabor rupture of membranes. Patient is s/p 3 doses of misoprostol followed by pitocin then an additional 3 doses of misoprostol. Patient now on pitocin. IUPC placed last night at approximately 1130P. Reports good fetal movement. No complaints. Epidural in place. Patient asking about elective primary cesarean delivery and states her mother had similar course where she was in labor for 3+ days when she finally underwent primary cesarean delivery.      Physical Exam:     Vitals:    07/01/21 0630 07/01/21 0645 07/01/21 0700 07/01/21 0745   BP: 92/55 117/55 105/56    Pulse: 96 90 (!) 106    Resp:       Temp:    98.7 F (37.1 C)   TempSrc:    Oral   Weight:       Height:           Intake and Output Summary (Last 24 hours) at Date Time    Intake/Output Summary (Last 24 hours) at 07/01/2021 0916  Last data filed at 07/01/2021 0531  Gross per 24 hour   Intake --   Output 1300 ml   Net -1300 ml       Fetal Heart Baseline Rate: 130 BPM moderate variability, Accelerations seen, No decelerations seen, and Category 1    Last Cervical Exam:  Dilation: 2 (07/01/21 0911)  Effacement (%): 80 (07/01/21 0911)  Cervical Characteristics: Posterior (07/01/21 0911)  Station: -2 (07/01/21 0911)  Presentation: Vertex (07/01/21 0911)  Method: Manual (07/01/21 0911)  OB Examiner: Jamian Andujo (07/01/21 0911)    Assessment/Plan:   Patient is a 21 y.o. G2P0010 at 32w6dprelabor rupture of membranes; Rh negative s/p RhoGAM 04/19/2021  Pitocin currently at 20  Patient counseled fetal tracing is reassuring. Patient is not in active labor. Patient stable. Discuss power, passenger and pelvis. Patient counseled power is not strong yet. Recommend continued augmentation as both mom and baby look reassuring and no clear indication for primary cesarean delivery. Patient acknowledges and wishes to proceed.  Will repeat CBC and type and screen  RN may half pitocin then go back up.    NCatheryn Bacon MD

## 2021-07-01 NOTE — Progress Notes (Addendum)
MOM     21 y.o.  G2P1011  26w6dweeks  Allergies   Allergen Reactions    Doxycycline Hives and Nausea And Vomiting    Amoxicillin Rash    Promethazine Rash     Her current pregnancy is significant for   Patient Active Problem List   Diagnosis    Abdominal pain affecting pregnancy    Nausea and vomiting during pregnancy    Palpitation    Decreased fetal movement    Headache    Delivery normal         Past Medical History:   Diagnosis Date    Anemia     Headache     uses tylenol for migraines    Hx MRSA infection 2018       Past Surgical History:   Procedure Laterality Date    TONSILLECTOMY  2022    WISDOM TOOTH EXTRACTION  2020       Pertinent prenatal Labs -   Lab Results   Component Value Date    ABORH AB NEG 07/01/2021    HEPBSAG Negative 12/04/2020    GBS Negative 06/17/2021    RUBELLAABIGG Immune 12/04/2020    RPR Nonreactive 04/18/2021    RPR Non Reactive 04/18/2021    CTRACHOMATCX Negative 05/29/2021    CULTUREGONOR Negative 05/29/2021       Interpreter Waiver Signed: N/A    Delivering Clinician:       Delivery Type:     Vaginal, Spontaneous   Delivery Date and Time: 07/01/2021  9:08 PM     Delivery Complications: Maternal temperature 100.4 post delivery, tylenol '650mg'$  given     Rupture Date - 06/28/2021   Rupture time - 10:30 AM   Fluid color - Clear     Laceration (Yes/None):        Laceration Type:     None   Episiotomy:     None      Anesthesia: Epidural      ADDITIONAL COMMENTS:    Prenatal Scanned into Epic: Yes    ERAC protocol started N/A  Pt given gum in PACU post-op c-section N/A    Ibuprofen given prior to [facility](for [facility] only)- Yes  Other Medications given in L&D (list ERAC medications if applicable)- TXA, methergine, pitx2, miso 8045m rectal    Any SSRI medications-none    Recent Output- 500 '@2220'$      Fundus- firm//mid//U  Lochia- rubra, mod (clots removed by MD), no clots during fundal checks      EBL: 600 ,0   QBL:     Pain Score: 5    Prolonged 2nd stage of labor  No        Precipitous labor < 3 hours  No       Prolong Oxytocin use  Yes       Patient up with SARASTEADY -  Yes    Per Dr. JaJeneen Rinkscontinue PO methergine as long as pt's BP WNL.             BABY    [employer]- [phone]   Baby Safety Contract Signed Yes       Vaginal, Spontaneous  of live female  infant 7 lb 2.5 oz (3245 g)      1 Minute Apgar:     7      5 Minute Apgar:     9     Nuchal Cord:      Fenton Growth Scale Percentile:  60%   >90% or <10% No  ; Hypoglycemia Protocol Initiated: No        Coombs Results: negative; Lab Notified of Cord Bili needing to be drawn Not Applicable    Other Protocols on Newborn: sepsis  ADDITIONAL COMMENTS:  Feeding: Breast feeding  Skin-to-skin initiated Yes               Labs due- none  Cord blood- Lab/Blood Bank   Eyes & Thighs: done    Void - Yes   Stool - Yes   Circumcision      Pediatrician Notification:  If not house pediatrician, was private Pediatrician Notified: No, peds notification form filled out Yes    At home Pediatrician: [facility] Pediatrician: Doristine Church and baby transferred from L&D to [facility] room 334. ID bands on baby's ankles match mom's. Verified with L&D RN. Bedside report given to Barnett Applebaum RN using IS[facility]APED report. Pt care relinquished.    Gwen Pounds, RN  07/01/2021

## 2021-07-01 NOTE — Progress Notes (Signed)
Report received from [facility], pt. Care assumed at this time.

## 2021-07-01 NOTE — Progress Notes (Signed)
Pt. Reports constant strong pressure, SVE 9.5/100/+1. Pt. Educated on laboring down & positioned into high fowlers. RN encouraged to push epidural button if too uncomfortable.

## 2021-07-01 NOTE — Progress Notes (Signed)
BP 124/64   Pulse 99   Temp 97.7 F (36.5 C) (Oral)   Resp 18   Ht 1.499 m ('4\' 11"'$ )   Wt 79.4 kg (175 lb)   SpO2 100%   BMI 35.35 kg/m   Per RN another 250 cc of bleeding.  Methergine given x 1 dose.  Well transfer to PP when stable.  EBL now 600 cc.    Jobie Quaker, MD

## 2021-07-01 NOTE — Plan of Care (Signed)
Problem: Vaginal/Cesarean Delivery  Goal: Maternal Status within defined parameters  Outcome: Progressing  Goal: Evidence of Fetal Well Being  Outcome: Progressing  Goal: Free from Maternal/Fetal Infection  Outcome: Progressing  Goal: Intrapartum management of pain/discomfort  Outcome: Progressing  Goal: Breasts are soft with nipple integrity intact  Outcome: Progressing  Goal: Gastrointestinal/Urinary management  Outcome: Progressing  Goal: Uterine management  Outcome: Progressing  Goal: Perineum will be clean, dry, and intact and without discharge or hematoma  Outcome: Progressing  Goal: VTE Prevention  Outcome: Progressing  Goal: Evidence of positive mother-baby interactions  Outcome: Progressing     Problem: Moderate/High Fall Risk Score >5  Goal: Patient will remain free of falls  Outcome: Progressing

## 2021-07-01 NOTE — Progress Notes (Signed)
Labor Progress    Subjective:   Patient reports 7/10 pain with contractions. Reports good fetal movement.      Physical Exam:     Vitals:    07/01/21 0920 07/01/21 0930 07/01/21 1020 07/01/21 1030   BP: 109/58 109/58 109/59 109/59   Pulse: (!) 115 (!) 115 96 96   Resp:       Temp:    99.2 F (37.3 C)   TempSrc:       Weight:       Height:           Intake and Output Summary (Last 24 hours) at Date Time    Intake/Output Summary (Last 24 hours) at 07/01/2021 1322  Last data filed at 07/01/2021 0531  Gross per 24 hour   Intake --   Output 1300 ml   Net -1300 ml       Fetal Heart Baseline Rate: 135 BPM moderate variability, Accelerations seen, No decelerations seen, and Category 1    Last Cervical Exam:  Dilation: 3 (07/01/21 1300)  Effacement (%): 80 (07/01/21 1300)  Cervical Characteristics: Mid-position (07/01/21 1300)  Station: -3 (07/01/21 1300)  Presentation: Vertex (07/01/21 0911)  Method: Manual (07/01/21 0911)  OB Examiner: Vonte Rossin (07/01/21 0911)    Results       Procedure Component Value Units Date/Time    Antibody Identification [id number] Collected: 07/01/21 1010     Updated: 07/01/21 1138     Antibody ID POS    Narrative:      Pre-Surgical/Pre-Procedure->Yes  Has the patient been transfused w/i the last 3 months?->No  Has patient been pregnant within past 3 months?->Yes  For transfusion?->No    Type and Screen [id number] Collected: 07/01/21 1010    Specimen: Blood Updated: 07/01/21 1100     ABO Rh AB NEG     AB Screen Gel POS    Narrative:      Pre-Surgical/Pre-Procedure->Yes  Has the patient been transfused w/i the last 3 months?->No  Has patient been pregnant within past 3 months?->Yes  For transfusion?->No    CBC without differential [id number]  (Abnormal) Collected: 07/01/21 1010    Specimen: Blood Updated: 07/01/21 1024     WBC 12.55 x10 3/uL      Hgb 11.2 g/dL      Hematocrit 34.7 %      Platelets 186 x10 3/uL      RBC 4.39 x10 6/uL      MCV 79.0 fL      MCH 25.5 pg      MCHC 32.3 g/dL      RDW 16 %       MPV 11.5 fL      Nucleated RBC 0.0 /100 WBC      Absolute NRBC 0.00 x10 3/uL             Assessment/Plan:   Patient is a 21 y.o. G2P0010 at 74w6dprelabor rupture of membranes; Rh negative s/p RhoGAM 4/7  Pitocin currently at 26  Continue augmentation and to monitor    NCatheryn Bacon MD

## 2021-07-01 NOTE — Progress Notes (Signed)
L&D Note:    Patient has no complaint.    87      96/52    FHT 140 cat 1 moderate variability, + accels, no decel  Toco ctx difficult to pick up with external monitor    Pitocin at 12 mu/min    Cervix unchanged 70/1/-2  IUPC inserted. Blood noted in the IUPC catheter but no bleeding noted vaginally. IUPC catheter flushed.    Contractions noted to be 60 to 75 mm Hg every 4 to 5 minutes = 120-140 [place] units by IUPC  Will continue to increase Pitocin until adequate contractions.

## 2021-07-01 NOTE — Plan of Care (Signed)
Problem: Vaginal/Cesarean Delivery  Goal: Maternal Status within defined parameters  Outcome: Progressing  Flowsheets (Taken 06/30/2021 0820 by Stephania Fragmin, RN)  Maternal status with defined parameters:   Monitor/assess vital signs   Perform physical assessment per phase of care   Manage complications/co-morbidities per LIP orders  Goal: Evidence of Fetal Well Being  Outcome: Progressing  Flowsheets (Taken 06/30/2021 0820 by Stephania Fragmin, RN)  Evidence of fetal well being:   Monitor/assess fetal heart rate. Notify LIP of Category II or III EFM tracings.   Position patient for maximum uterine perfusion   Initiate interventions for Category II or III EFM strip   Patient's position should support labor progress and expulsion efforts  Goal: Free from Maternal/Fetal Infection  Outcome: Progressing  Flowsheets (Taken 06/30/2021 0820 by Stephania Fragmin, RN)  Free from Maternal/Fetal Infection:   Assess for infection risk using the appropriate screening tool   Determine GBS status. If GBS positive, manage per LIP orders.  Goal: Intrapartum management of pain/discomfort  Outcome: Progressing  Flowsheets (Taken 06/30/2021 0820 by Stephania Fragmin, RN)  Intrapartum management of pain/discomfort:   Keep pain at acceptable level for patient   Assess pain using a consistent, developmental/age appropriate pain scale   Assess pain level before and following intervention   Monitor hemodynamic parameters in response to pain/sedation medications   Assess and manage side effects associated with pain management   Include patient/patient care companion in decisions related to pain management   Support patient for natural childbirth with non-pharmacologic pain management or with desired pharmacologic management such as epidural   Report ineffective pain management to LIP   Consult/collaborate with Anesthesia or Pain Service

## 2021-07-01 NOTE — Progress Notes (Signed)
Labor Progress    Subjective:   Patient s/p epidural replacement. Reports good fetal movement.      Physical Exam:     Vitals:    07/01/21 1020 07/01/21 1030 07/01/21 1744 07/01/21 1759   BP: 109/59 109/59 125/57    Pulse: 96 96 (!) 116    Resp:       Temp:  99.2 F (37.3 C)  98.9 F (37.2 C)   TempSrc:    Oral   Weight:       Height:           Intake and Output Summary (Last 24 hours) at Date Time    Intake/Output Summary (Last 24 hours) at 07/01/2021 1830  Last data filed at 07/01/2021 1100  Gross per 24 hour   Intake --   Output 2050 ml   Net -2050 ml       Fetal Heart Baseline Rate: 135 BPM moderate variability, Accelerations seen, No decelerations seen, and Category 1    Last Cervical Exam:  Dilation: 5 (07/01/21 1802)  Effacement (%): 90 (07/01/21 1802)  Cervical Characteristics: Mid-position (07/01/21 1802)  Station: -1 (07/01/21 1802)  Presentation: Vertex (07/01/21 1802)  Method: Manual (07/01/21 1802)  OB Examiner: Melvin Whiteford (07/01/21 1802)    Assessment/Plan:   Patient is a 21 y.o. G2P0010 at 76w6dprelabor rupture of membranes, Rh negative s/p RhoGAM  Pitocin currently at 30  Continue augmentation and monitoring  Anticipate delivery    NCatheryn Bacon MD

## 2021-07-02 LAB — RH IMM GLOBULIN

## 2021-07-02 LAB — FETAL BLEED SCREEN: Fetal Bleed Screen: NEGATIVE

## 2021-07-02 MED ORDER — RHO D IMMUNE GLOBULIN 1500 UNITS IM SOSY
300.0000 ug | PREFILLED_SYRINGE | Freq: Once | INTRAMUSCULAR | Status: AC
Start: 2021-07-02 — End: 2021-07-02
  Administered 2021-07-02: 300 ug via INTRAMUSCULAR
  Filled 2021-07-02: qty 300

## 2021-07-02 MED ORDER — METHYLERGONOVINE MALEATE 0.2 MG PO TABS
0.2000 mg | ORAL_TABLET | Freq: Four times a day (QID) | ORAL | Status: AC
Start: 2021-07-02 — End: 2021-07-02
  Administered 2021-07-02 (×3): 0.2 mg via ORAL
  Filled 2021-07-02 (×3): qty 1

## 2021-07-02 MED ORDER — STERILE WATER FOR INJECTION IJ/IV SOLN (WRAP)
2.0000 g | Freq: Once | INTRAMUSCULAR | Status: AC
Start: 2021-07-02 — End: 2021-07-02
  Administered 2021-07-02: 2 g via INTRAVENOUS
  Filled 2021-07-02: qty 2000

## 2021-07-02 NOTE — Progress Notes (Signed)
Postpartum Note    PPD # 1 s/p NSVD    S: Patient doing well without complaints. Ambulating independently. Voiding spontaneously. Tolerating regular diet without nausea or vomiting. Pain well controlled with PO pain meds. Difficulty with breastfeeding, supplementing with donor milk. Lochia normal.    Visit Vitals  BP 118/80   Pulse 97   Temp 98.8 F (37.1 C) (Oral)   Resp 18   Ht 1.499 m ('4\' 11"'$ )   Wt 79.4 kg (175 lb)   SpO2 97%   Breastfeeding Unknown   BMI 35.35 kg/m       Gen: alert, NAD  Abd: soft, non tender. Fundus firm below umbilicus  Ext: non tender, no edema    Lab Results   Component Value Date    WBC 12.55 (H) 07/01/2021    HGB 11.2 (L) 07/01/2021    HCT 34.7 07/01/2021    MCV 79.0 07/01/2021    PLT 186 07/01/2021         A/P: PPD# 1 s/p SVD    Routine PP:  -Routine PP care. Advance diet. Ambulate. Lactation assistance as needed.   -RH negative. Rhogam ordered, baby B pos  -Baby boy. Circ: done. Risks of circumcision discussed including: bleeding, infection, damage to the penis, poor cosmetic appearance. All questions answered and consent obtained.   -Vaccines needed: non  -Anticipated discharge PPD #2      Concepcion Elk, MD

## 2021-07-02 NOTE — Plan of Care (Signed)
Problem: Vaginal/Cesarean Delivery  Goal: Breasts are soft with nipple integrity intact  Outcome: Progressing  Flowsheets (Taken 07/02/2021 2221)  Breasts are soft with nipple integrity intact:   Perform breast/nipple assessment   Breastfeed and/or pump breasts at least 8-12 times within 24 hours  Goal: Uterine management  Outcome: Progressing  Flowsheets (Taken 07/02/2021 2221)  Uterine management:   Assess fundus and notify LIP if not firm, midline, or at or below the umbilicus, or if abdomen is abnormally distended   Assess for hemorrhage risk using appropriate screening tool  Goal: Perineum will be clean, dry, and intact and without discharge or hematoma  Outcome: Progressing  Flowsheets (Taken 07/02/2021 2221)  Perineum will be clean, dry, and intact and without discharge or hematoma:   Place cold pack on perineum   Provide pericare   Monitor wound/incision for signs of infections   Assess for hemorrhoids and provide interventions  Goal: VTE Prevention  Outcome: Progressing  Flowsheets (Taken 07/02/2021 2221)  VTE prevention:   Patient ambulating independently   Assess skin color, turgor, peripheral pulses, perfusion, and presence of edema, e.g. capillary refill  Goal: Evidence of positive mother-baby interactions  Outcome: Progressing  Flowsheets (Taken 07/02/2021 2221)  Evidence of positive mother-baby interactions:   Include patient/patient care companion in decisions related to care   Initiate skin to skin   Initiate safety and falls prevention interventions   Assess emotional status and coping mechanisms   Encourage rooming in and infant feeding on demand   Ensure parent/caregiver provides infant care  Goal: Postpartum management of pain/discomfort  Outcome: Progressing  Flowsheets (Taken 07/02/2021 2221)  Postpartum management of pain/discomfort:   Assess pain level before and following intervention   Assess pain using a consistent, developmental/age appropriate pain scale   Include patient/patient care  companion in decisions related to pain management   Offer non-pharmacologic pain management interventions

## 2021-07-02 NOTE — UM Notes (Signed)
Primary Coverage: [facility]    Patient Name: Christina Reilly       DOB: Jan 31, 2000  MRN: [id number]      Copy of Admission Order:  06/28/21 1508  ADMIT TO OBSERVATION (OUTPATIENT WITH OBSERVATION SERVICES)  Once            07/01/21 2151  ADMIT TO INPATIENT  Once        Diagnosis: Delivery Normal   Level of Care: Intermediate Care   Patient Class: Inpatient    References:    [facility] Bed Placement Criteria    [facility] Bed Placement Criteria    [facility] Bed Placement Criteria    [facility] Bed Placement Criteria    [facility] Bed Placement Criteria    New Service Definitions Feb 2023   Question Answer Comment   Admitting Physician Jobie Quaker    Service: OB Post Partum    Estimated Length of Stay > or = to 2 midnights    Tentative Discharge Plan? Home or Self Care    Does patient need telemetry? No              DIAGNOSIS    ICD-10-CM    1. Delivery normal  O80             ADMITTED ON: 06/28/2021 at 1419 for Inpatient      PMH:  has a past medical history of Anemia, Headache, and MRSA infection (2018).    Reason For Hospitalization:    Christina Reilly is a 21 yr old female G2P0 at 44w2dwith SROM at 10am, clear fluid, and confirmed in office and vertex presentation.  Pt reports contractions but rare.    Vitals:   Patient Vitals for the past 24 hrs:   BP Temp Temp src Pulse Resp SpO2   07/02/21 0755 118/80 98.8 F (37.1 C) Oral 97 18 97 %   07/02/21 0303 110/77 99 F (37.2 C) Oral 89 18 96 %   07/02/21 0000 -- 100.2 F (37.9 C) Oral -- -- --   07/01/21 2350 120/82 (!) 101.1 F (38.4 C) Oral 83 20 96 %   07/01/21 2309 125/78 -- -- 100 -- --   07/01/21 2254 126/85 -- -- (!) 105 -- --   07/01/21 2239 122/63 -- -- (!) 106 -- --   07/01/21 2224 124/64 -- -- 99 -- --   07/01/21 2209 140/81 -- -- (!) 110 -- --   07/01/21 2154 138/73 -- -- (!) 115 -- --   07/01/21 2139 137/69 -- -- 100 -- --   07/01/21 2127 139/72 -- -- (!) 110 -- --   07/01/21 2010 127/76 -- -- (!) 109 -- --   07/01/21 2000 119/60 -- -- (!) 108 -- --   07/01/21 1941 133/83  -- -- (!) 111 -- --   07/01/21 1930 116/72 97.7 F (36.5 C) Oral 95 -- 100 %   07/01/21 1830 137/63 -- -- (!) 103 -- --   07/01/21 1817 123/58 -- -- 94 -- --   07/01/21 1814 119/56 -- -- 94 -- --   07/01/21 1811 117/58 -- -- (!) 102 -- --   07/01/21 1808 119/58 -- -- 96 -- --   07/01/21 1805 111/59 -- -- (!) 110 -- --   07/01/21 1802 114/58 -- -- (!) 105 -- --   07/01/21 1800 117/56 -- -- (!) 115 -- --   07/01/21 1759 -- 98.9 F (37.2 C) Oral -- -- --   07/01/21 1756 113/56 -- -- (Marland Kitchen  110 -- --   07/01/21 1744 125/57 -- -- (!) 116 -- --         Temp:  [97.7 F (36.5 C)-101.1 F (38.4 C)]   Heart Rate:  [83-116]   Resp Rate:  [18-20]   BP: (110-140)/(56-85)   SpO2:  [96 %-100 %]     Last recorded pain score:  Pain Scale Used: Numeric Scale (0-10)  Pain Score: 1-mild pain         Abnormal Labs:    Lab Results last 48 Hours       Procedure Component Value Units Date/Time    CBC without differential [id number]  (Abnormal) Collected: 07/01/21 1010    Specimen: Blood Updated: 07/01/21 1024     WBC 12.55 x10 3/uL      Hgb 11.2 g/dL      RDW 16 %        Delivery Report  Time of Delivery: 2108  07/01/2021   Attending/Supervising MD:  Jobie Quaker, MD   Delivering Provider: Jobie Quaker, MD  Procedure: Spontaneous vaginal delivery  Anesthesia: Epidural   A female  infant was delivered from LOA presentation.  Infant Wt:      7 lb 2.5 oz (3245 g)                  Infant Name:  Elijah  Apgars:  7  at 43mnute and 9  at 5 minutes   Gestational Age: 4656w6d    Treatment Plan:   OB 07/02/2021  A/P: PPD# 1 s/p SVD  Routine PP:  -Routine PP care. Advance diet. Ambulate. Lactation assistance as needed.   -RH negative. Rhogam ordered, baby B pos  -Baby boy. Circ: done. Risks of circumcision discussed including: bleeding, infection, damage to the penis, poor cosmetic appearance. All questions answered and consent obtained.   -Vaccines needed: non  -Anticipated discharge PPD #2        Orders    Scheduled Meds:  Current  Facility-Administered Medications   Medication Dose Route Frequency    ibuprofen  600 mg Oral Q6H    methylergonovine  0.2 mg Oral 4 times per day    prenatal vitamin  1 tablet Oral Daily    rho(D) immune globulin  300 mcg Intramuscular Once     Continuous Infusions:   fentaNYL 2 mcg/mL + bupivacaine 0.125 % 10 mL/hr at 07/01/21 1954     PRN Meds:.acetaminophen **OR** acetaminophen, benzocaine-lanolin-aloe vera, bisacodyl, hydrocortisone, lanolin, magnesium hydroxide, measles, mumps & rubella vaccine, methylergonovine, methylergonovine **OR** methylergonovine, miSOPROStol, miSOPROStol, naloxone, naloxone, NIFEdipine, NIFEdipine, ondansetron **OR** ondansetron, oxyCODONE, oxyCODONE, promethazine **OR** promethazine (PHENERGAN) IVPB **OR** promethazine, senna-docusate, TdaP Booster, witch hazel        This clinical review is based on compiled documentation provided by the treatment team within the patient's medical record.  ________________________________________      UTILIZATION REVIEW CONTACT :   RoRegis BillMSN, RN, ACM  Utilization Review Case Manager ll   Phone: [phone] only)  [place]: [phone] Line [phone] [phone])  [email or web address]    Tax ID: [id number] County Memorial Hospital (IASt. JosephInStone [facility] Surgery [facility] (IFBryn Mawr-SkywayInLebanon Hospital (IHonorhealth Deer Valley Medical CenterInSt. Mary'S [facility] (ICuyuna Regional Medical CenterInWood[facility](IN[facility]  [address] [id number] [id number]Fa[address] [id number] 262 7777iCommunity Health Network Rehabilitation South LeFortunaVA [id number]  [place], [place] [address]   NPI: [id number] NPI: [id number] NPI: [id number] NPI: [id number] NPI: [id number]

## 2021-07-02 NOTE — Progress Notes (Signed)
Mom and baby transferred from L&D to [facility] room 334.  ID bands on baby's ankles, match mom's.  Verified with L&D RN.  Correct pediatrician verified with mom.

## 2021-07-02 NOTE — Plan of Care (Signed)
Problem: Vaginal/Cesarean Delivery  Goal: Breasts are soft with nipple integrity intact  Outcome: Progressing  Flowsheets (Taken 07/02/2021 0836)  Breasts are soft with nipple integrity intact:   Perform breast/nipple assessment   Assess and manage engorgement   Breastfeed and/or pump breasts at least 8-12 times within 24 hours  Goal: Gastrointestinal/Urinary management  Outcome: Progressing  Flowsheets (Taken 07/02/2021 0836)  Gastrointestinal/Urinary management: Adequate bladder emptying per phase of care  Goal: Uterine management  Outcome: Progressing  Flowsheets (Taken 07/02/2021 0836)  Uterine management:   Assess fundus and notify LIP if not firm, midline, or at or below the umbilicus, or if abdomen is abnormally distended   Assess for hemorrhage risk using appropriate screening tool  Goal: Perineum will be clean, dry, and intact and without discharge or hematoma  Outcome: Progressing  Flowsheets (Taken 07/02/2021 0836)  Perineum will be clean, dry, and intact and without discharge or hematoma:   Place cold pack on perineum   Provide pericare  Goal: VTE Prevention  Outcome: Progressing  Flowsheets (Taken 07/02/2021 0836)  VTE prevention: Patient ambulating independently  Goal: Evidence of positive mother-baby interactions  Outcome: Progressing  Flowsheets (Taken 07/02/2021 0836)  Evidence of positive mother-baby interactions:   Assess emotional status and coping mechanisms   Encourage rooming in and infant feeding on demand   Ensure parent/caregiver provides infant care  Goal: Postpartum management of pain/discomfort  Outcome: Progressing  Flowsheets (Taken 07/02/2021 0836)  Postpartum management of pain/discomfort:   Assess pain level before and following intervention   Assess pain using a consistent, developmental/age appropriate pain scale     Problem: Moderate/High Fall Risk Score >5  Goal: Patient will remain free of falls  Outcome: Progressing

## 2021-07-02 NOTE — Lactation Note (Addendum)
Shaundria Allegra  18-Sep-2000  21 y.o.     Delivery Date/Time: 07/01/2021  9:08 PM        Delivery Type:  Vaginal, Spontaneous             Laceration Type:     None   Episiotomy:     None      GP: G2P1011 EBL: : 600 , 0    OB:    Chau, Hong-Hanh T, MD  Allergies   Allergen Reactions    Doxycycline Hives and Nausea And Vomiting    Amoxicillin Rash    Promethazine Rash       PRENATAL    Past Medical History:   Diagnosis Date    Anemia     Headache     uses tylenol for migraines    Hx MRSA infection 2018     Past Surgical History:   Procedure Laterality Date    TONSILLECTOMY  2022    WISDOM TOOTH EXTRACTION  2020             Baby Gender: Female   Gestation:  [redacted]w[redacted]d APGAR: 7 and 9   Birth Weight: 7 lb 2.5 oz (3245 g)   Feeding Type:  Breast milk, Donor breast milk               07/02/21 1200   Lactation Consultation   Visit Type  Initial Assessment   Feeding Assessment   Infant Level of Arousal Awake;Easily aroused to stimulation;Infant placed skin to skin;Encouraged skin to skin   Feeding Positions Used Football;Semi-reclining   Alignment Nose to nipple;Chin touching breast   Areolar Grasp   (no latch achieved)   Areolar Compression Chewing   Suck/Swallow   (expressed colostrum into infant's mouth)   Feeding Supplements   Supplements Yes   Type Other (Comment)  (DHM)   Route   (bottle nipple)   Amount per feeding 7 ml   OTHER   Oral Assessment (WDL) X   Oral Assessment   Mouth Tight Frenulum;High Palate   Feeding Information   Feeding Tolerance Refuses to suck;Uncoordinated suck        07/02/21 1200   Lactation Consultation   Visit Type  Initial Assessment   Maternal Information   Breastfeeding Status Yes   Previous Breastfeeding Experience No   Taking meds incompatible with breastfeeding N   Storing milk  N   Pumping Experience N   Pumping this hospitalization Y   Dumping Milk N   Breast Assessment   Left Breast Large;Pendulous   Left Nipple Inverted   Right Breast Large;Pendulous;Colostrum Present   Right Nipple  Evert;Short   Hand expression done (if baby in NICU) Yes  (moderate colostrum)   Pumping done Yes   Equipment   Equipment Hospital grade pump  (used [employer] pump parts)     Initial visit to this first time mother who wants to breast feed.  Attempted to latch infant but infant did not sustain a latch.  Gloved finger placed in infant's mouth and infant was uncoordinated and would chew, bite and gag.     Initial position used was football and then attempted to latch infant in a semi-reclined position on the right breast.  Infant did suck on breast for a few sucks and mother did feel pulling but infant did not sustain sucking.  This is a good position to attempt again since infant is able to sustain the latch.      Hand expressed right  breast for 0.43m which was given to the infant by syringe.    Nipple shield #20 placed on mother's right nipple and attempted for infant to suck on NS.  Infant was again uncoordinated.    DHM feeding brought by RN was given to the infant by bottle nipple.    Mother is pumping after supplementation given.  This was explained to mother the importance of pumping to protect her milk supply and to stimulate production.   Mother was shown hands on pumping.  Flange size checked and appear adequate but needs to be rechecked.    No colostrum seen in the flange after pumping although mother stated her first pumping she did have colostrum which she lost by removing the flanges too fast.    Plan is to encourage mother to hand express prior to feeding and feed to infant.  Attempt to relatch on the right breast in a semi-reclined position and if no latch to try NS again.    Parts from [facility] pump [facility] the [facility].    A request for a [facility] [employer] Pump faxed.     F/U with this mother to assess sucking coordination and latching.

## 2021-07-02 NOTE — Plan of Care (Signed)
Temperature on admission 101.4F, rechecked in 10 minutes 100.54F. Jeneen Rinks, MD aware. Ancef given x1 per order, see MAR. Patient remains afebrile through shift.   Problem: Vaginal/Cesarean Delivery  Goal: Breasts are soft with nipple integrity intact  Outcome: Progressing  Flowsheets (Taken 07/02/2021 0023)  Breasts are soft with nipple integrity intact:   Perform breast/nipple assessment   Breastfeed and/or pump breasts at least 8-12 times within 24 hours   Ensure proper positioning and latch  Patient with large pendulous breast and short dense nipples. Started patient on nipple shield and personal breast pump. Per patients request supplementing with donor human milk.   Goal: Gastrointestinal/Urinary management  Outcome: Progressing  Flowsheets (Taken 07/02/2021 0023)  Gastrointestinal/Urinary management: Adequate bladder emptying per phase of care  Goal: Uterine management  Outcome: Progressing  Flowsheets (Taken 07/02/2021 0023)  Uterine management:   Assess fundus and notify LIP if not firm, midline, or at or below the umbilicus, or if abdomen is abnormally distended   Assess for hemorrhage risk using appropriate screening tool  Goal: Perineum will be clean, dry, and intact and without discharge or hematoma  Outcome: Progressing  Flowsheets (Taken 07/02/2021 0023)  Perineum will be clean, dry, and intact and without discharge or hematoma:   Place cold pack on perineum   Provide pericare   Assess for hemorrhoids and provide interventions  Goal: VTE Prevention  Outcome: Progressing  Flowsheets (Taken 07/02/2021 0023)  VTE prevention:   Patient ambulating independently   Assess skin color, turgor, peripheral pulses, perfusion, and presence of edema, e.g. capillary refill  Goal: Evidence of positive mother-baby interactions  Outcome: Progressing  Flowsheets (Taken 07/02/2021 0023)  Evidence of positive mother-baby interactions:   Include patient/patient care companion in decisions related to care   Initiate skin to skin    Initiate safety and falls prevention interventions   Assess emotional status and coping mechanisms   Encourage rooming in and infant feeding on demand   Ensure parent/caregiver provides infant care  Goal: Postpartum management of pain/discomfort  Outcome: Progressing  Flowsheets (Taken 07/02/2021 0023)  Postpartum management of pain/discomfort:   Assess pain using a consistent, developmental/age appropriate pain scale   Assess pain level before and following intervention   Include patient/patient care companion in decisions related to pain management   Monitor for post anesthesia issues related to pain management   Offer non-pharmacologic pain management interventions

## 2021-07-02 NOTE — Anesthesia Postprocedure Evaluation (Signed)
Anesthesia Post Evaluation    Patient: Christina Reilly    * No procedures listed *    Anesthesia type: epidural    Last Vitals:   Vitals Value Taken Time   BP 118/80 07/02/21 0755   Temp 37.1 C (98.8 F) 07/02/21 0755   Pulse 97 07/02/21 0755   Resp 18 07/02/21 0755   SpO2 97 % 07/02/21 0755                 Anesthesia Post Evaluation:     Patient Evaluated: floor  Patient Participation: complete - patient participated  Level of Consciousness: awake and alert  Pain Score: 0  Pain Management: adequate    Airway Patency: patent    Anesthetic complications: No      PONV Status: none    Cardiovascular status: acceptable  Respiratory status: acceptable  Hydration status: acceptable  Comments: Patient satisfied with epidural for pain control.  She states that she has some tenderness at the site where the epidural catheter was placed, but she denies any headache, major back pain, or numbness, tingling, or weakness in her legs.        Signed by: Leana Roe, MD, 07/02/2021 8:52 AM

## 2021-07-02 NOTE — Lactation Note (Signed)
F/U with mother to attempt to latch again but mother stated her mother will be feeding the infant by bottle this time.  Mother was reminded to pump her breasts for 15 minutes.    Mother has a basin and reviewed cleansing of pump parts with dish detergent and hot water.  She is aware to sterilize pump parts q24 hours.      Mother encouraged to hand express prior to feeding to give as much of mother's colostrum to the infant.      F/U tomorrow to help latch/help with NS and to have a plan for at home.

## 2021-07-03 MED ORDER — IBUPROFEN 600 MG PO TABS
600.0000 mg | ORAL_TABLET | Freq: Four times a day (QID) | ORAL | 1 refills | Status: AC
Start: 2021-07-03 — End: ?

## 2021-07-03 NOTE — Progress Notes (Signed)
 Postpartum Note    Subjective:  Delivery Type: Vaginal   No complaints.  Minimal Bleeding.  Breastfeeding well.    Objective:  Vital Signs:  Temp:  [97.5 F (36.4 C)-98.6 F (37 C)] 97.5 F (36.4 C)  Heart Rate:  [72-109] 72  Resp Rate:  [15] 15  BP: (90-112)/(56-75) 90/56  Breast: soft, non tender   Fundus: firm, non tender  Lochia: mod, rubra.    Recent Labs:  Recent Labs   Lab 07/01/21  1010   WBC 12.55*   Hgb 11.2*   Hematocrit 34.7   Platelets 186       Assessment/Plan:  Stable - Postpartum Day 2  -Routine PP care. Advance diet. Ambulate. Lactation assistance as needed.   -RH neg Rho givwn 6/20  -Baby boy "Elijah" circ done  -Vaccines needed: none  -Anticipated discharge PPD #2  -Egan Rx: ibuprofen Sent to [facility]

## 2021-07-04 NOTE — Discharge Summary (Signed)
[facility]  Obstetrical Discharge Form      Admission Diagnosis: Intrauterine Pregnancy at [redacted]w[redacted]d   Antepartum complications: none    Date of Delivery: 07/01/2021    Time of Delivery: 9:08 PM      Delivered By: JJobie Quaker    Delivery Type: Vaginal, Spontaneous   Tubal Ligation: n/a    Anesthesia:     Baby: Liveborn Female ; Birth weight: 7 lb 2.5 oz (3245 g) ; Apgar 1 minute: 7  Apgar 5 minute: 9 ;                                                       Feeding Type: Donor breast milk     Delivery complications: prolonged first stage, prolonged ROM    Episiotomy: None   Laceration Type:  None     Prenatal Labs:   Lab Results   Component Value Date    ABORH AB NEG 07/01/2021    RUBELLAABIGG Immune 12/04/2020    HEPBSAG Negative 12/04/2020    GBS Negative 06/17/2021       Hospital Course : Christina Haseltineis a 21y.o. with an IUP at 380w6dShe was admitted to the hospital for PROM. Her antepartum course was uncomplicated.  Patient delivered via induced vaginal delivery. Her postpartum course was complicated by hemorrhage . She was discharged home in a stable good condition.    Discharge Date: 07/03/21     Plan:     [facility] Instructions:   The patient has been given a discharge instruction sheet which includes precautions for activity level, dietary precautions, expectations, potential complications that require contact with the office and discharge prescriptions. These instructions were reviewed with the patient. All questions were answered.    Follow-up appointment in 6 weeks if vaginal delivery and 2 weeks if c/section.

## 2021-08-13 IMAGING — DX DG FOOT COMPLETE 3+V*R*
3 series · 3 of 3 positions shown · non-contrast
Comparison: None.

CLINICAL DATA: Pain after dropping heavy object on foot

EXAM:
RIGHT FOOT COMPLETE - 3+ VIEW

[foot ap]
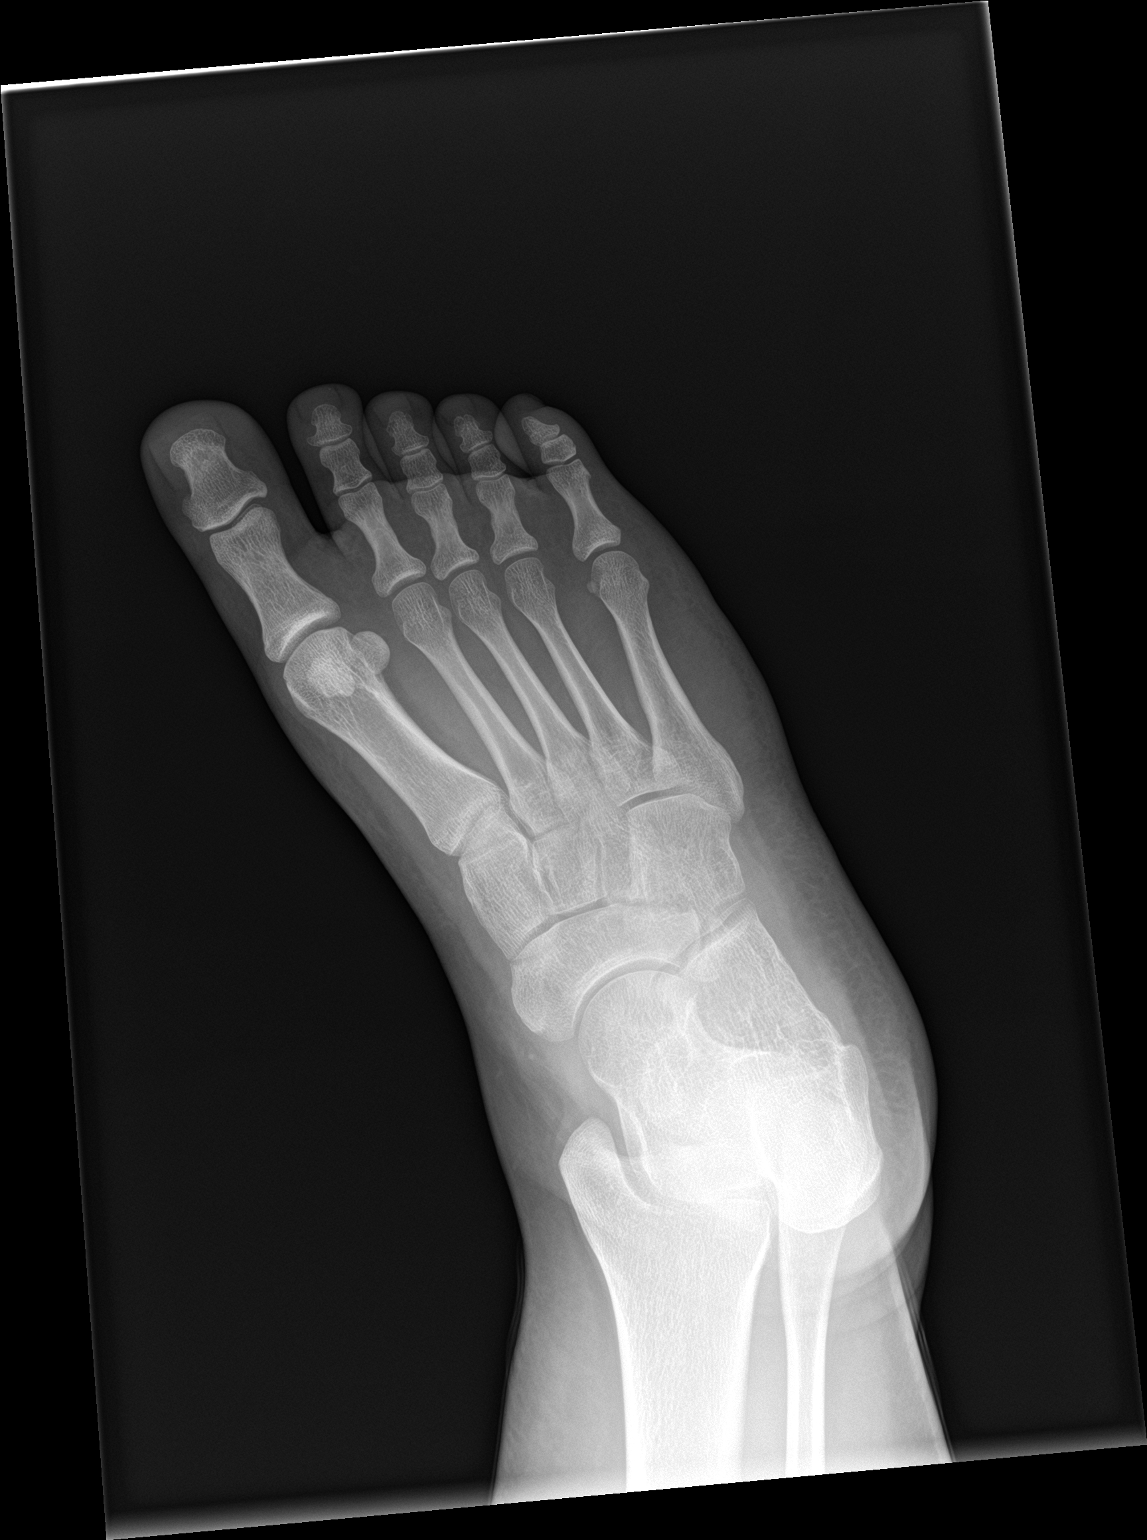

[foot obl]
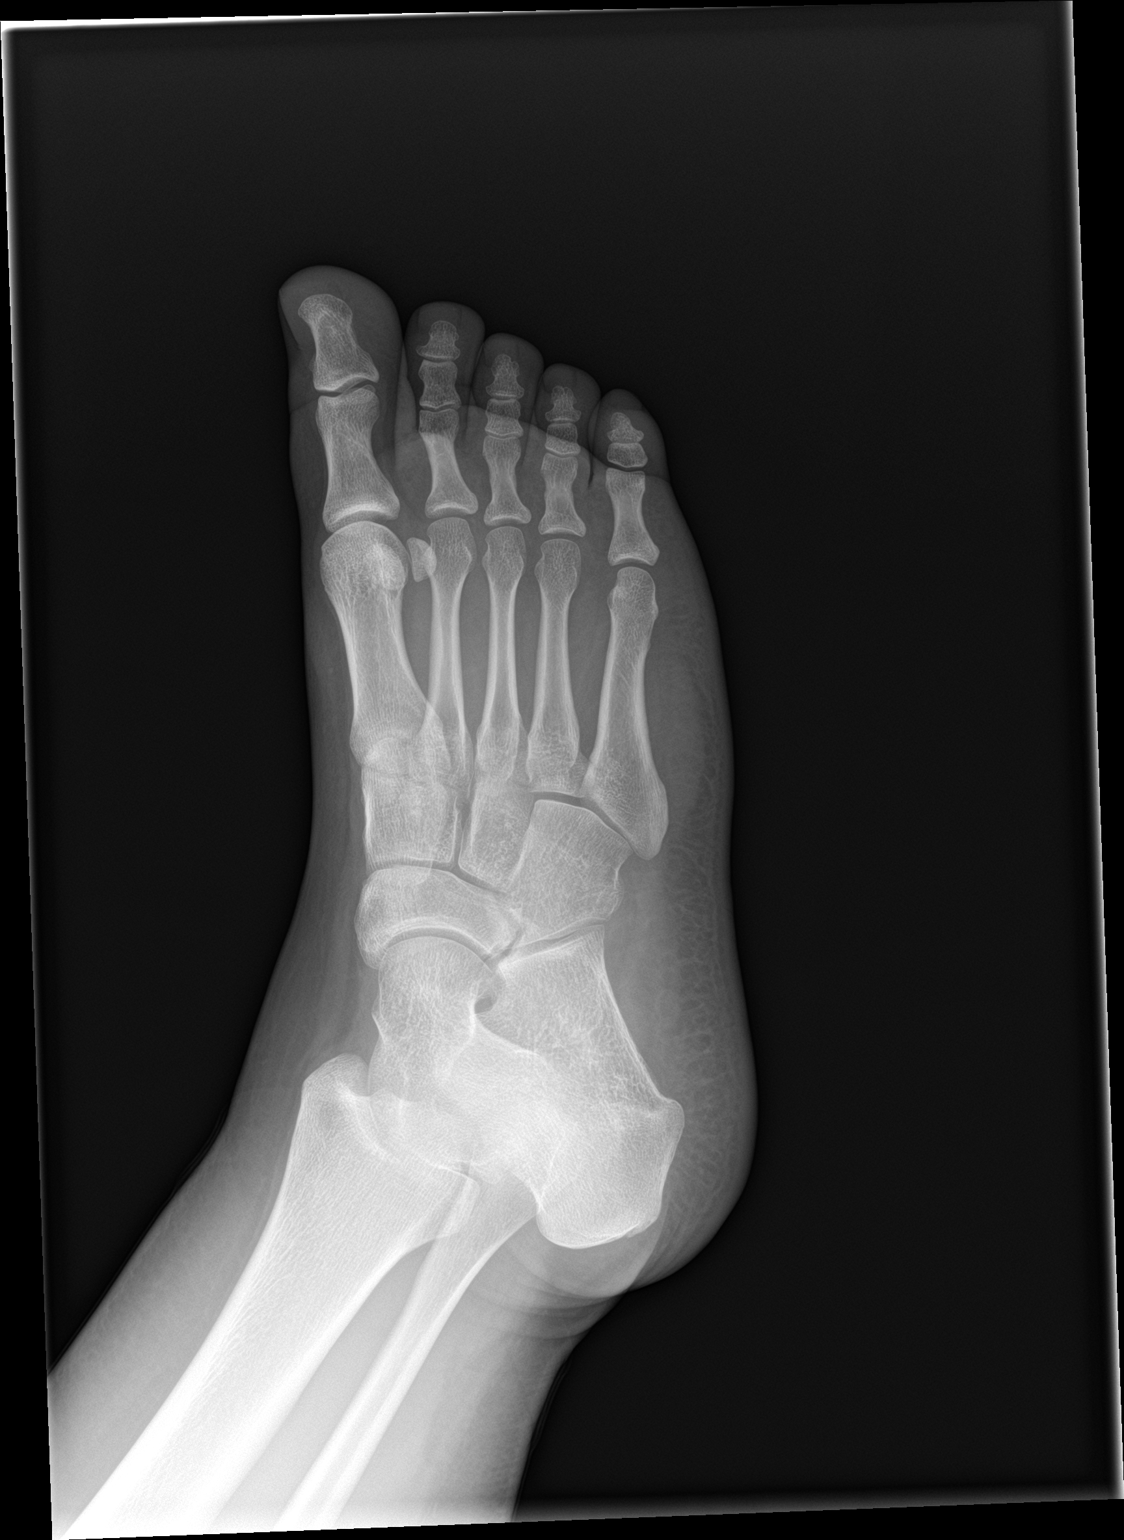

[foot lat]
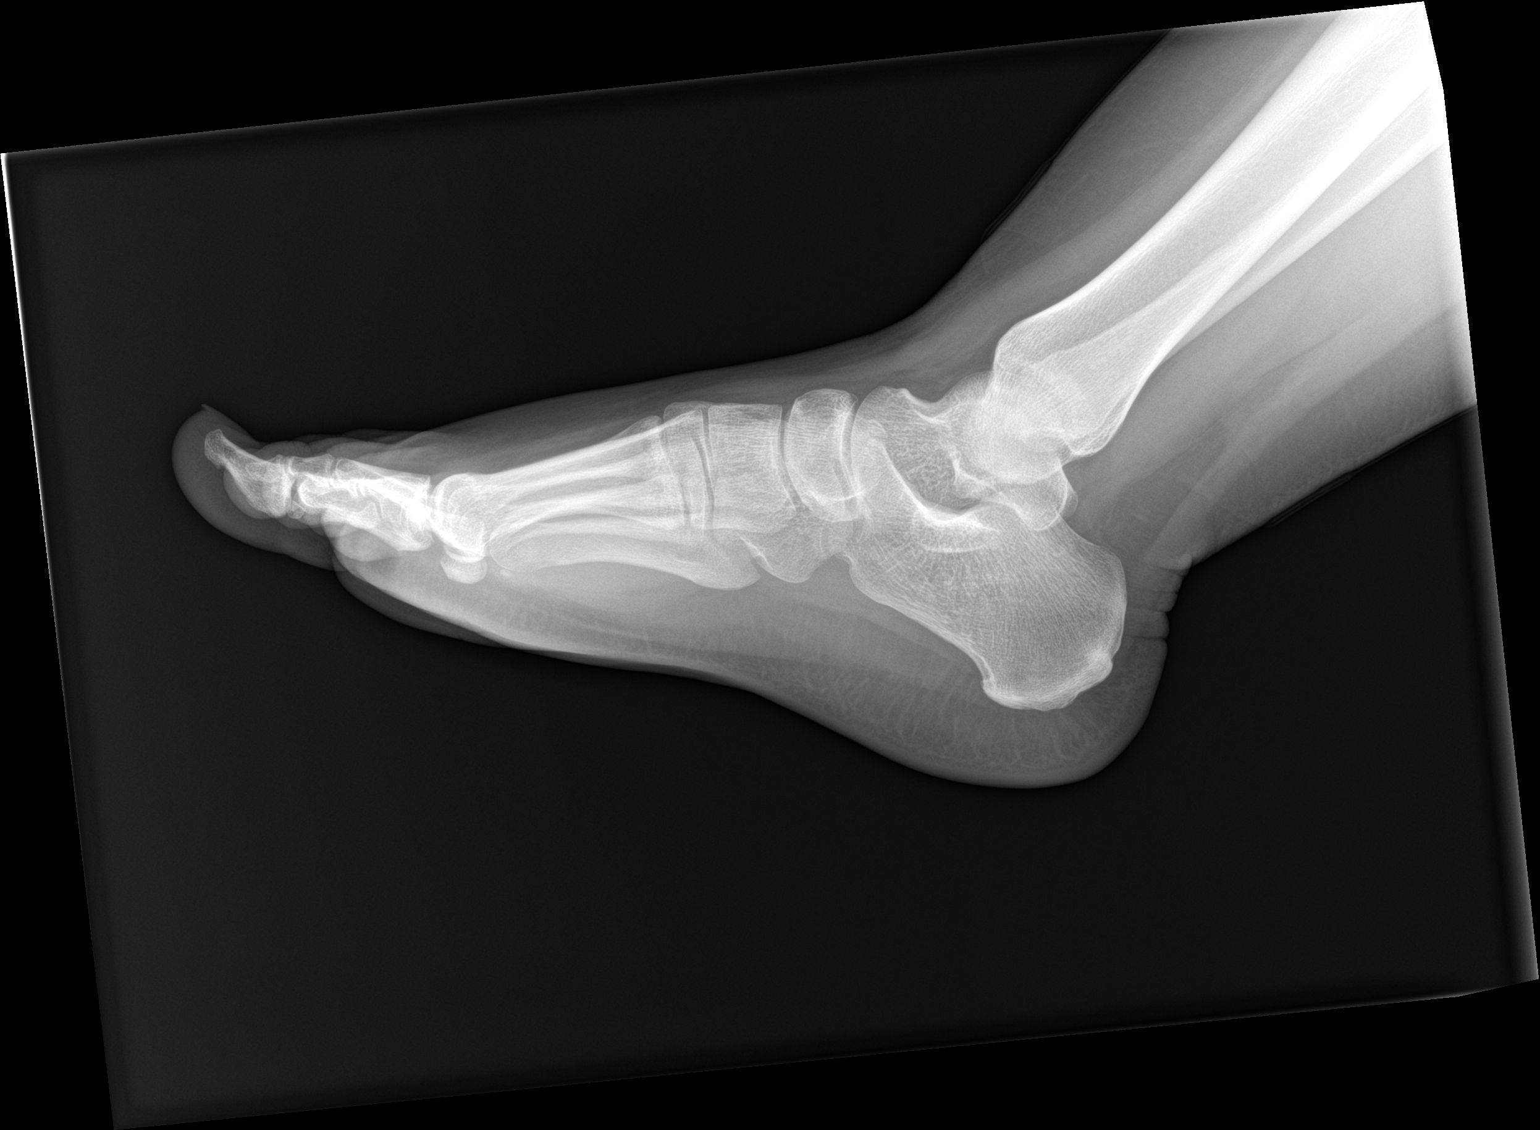

[3 of 3 positions shown; findings below may reference images not displayed]

FINDINGS: Frontal, oblique, and lateral views were obtained. There is no
fracture or dislocation. Joint spaces appear normal. No erosive
change.
IMPRESSION: No fracture or dislocation.  No evident arthropathy.
